# Patient Record
Sex: Female | Born: 1980 | Race: White | Hispanic: No | Marital: Married | Smoking: Never smoker
Health system: Southern US, Community
[De-identification: ages and names within clinical notes are randomized; demographics above are authoritative.]

## PROBLEM LIST (undated history)

## (undated) DIAGNOSIS — M419 Scoliosis, unspecified: Secondary | ICD-10-CM

## (undated) DIAGNOSIS — R011 Cardiac murmur, unspecified: Secondary | ICD-10-CM

## (undated) DIAGNOSIS — B999 Unspecified infectious disease: Secondary | ICD-10-CM

## (undated) DIAGNOSIS — F32A Depression, unspecified: Secondary | ICD-10-CM

## (undated) DIAGNOSIS — F329 Major depressive disorder, single episode, unspecified: Secondary | ICD-10-CM

## (undated) HISTORY — PX: FOOT SURGERY: SHX648

## (undated) HISTORY — PX: DILATION AND CURETTAGE OF UTERUS: SHX78

## (undated) HISTORY — PX: WISDOM TOOTH EXTRACTION: SHX21

## (undated) HISTORY — DX: Scoliosis, unspecified: M41.9

---

## 2010-09-14 ENCOUNTER — Ambulatory Visit: Payer: Self-pay | Admitting: Diagnostic Radiology

## 2010-09-14 ENCOUNTER — Ambulatory Visit (HOSPITAL_BASED_OUTPATIENT_CLINIC_OR_DEPARTMENT_OTHER): Admission: RE | Admit: 2010-09-14 | Discharge: 2010-09-14 | Payer: Self-pay | Admitting: Family Medicine

## 2010-09-14 ENCOUNTER — Ambulatory Visit: Payer: Self-pay | Admitting: Sports Medicine

## 2010-09-14 DIAGNOSIS — M545 Low back pain, unspecified: Secondary | ICD-10-CM | POA: Insufficient documentation

## 2010-09-14 DIAGNOSIS — M4120 Other idiopathic scoliosis, site unspecified: Secondary | ICD-10-CM | POA: Insufficient documentation

## 2011-01-19 NOTE — Assessment & Plan Note (Signed)
Summary: NP,BACK PAIN,MC   Vital Signs:  Patient profile:   30 year old female Height:      65 inches Weight:      147 pounds  History of Present Illness: 30 yo F here with low back pain  Patient reports known history of scoliosis when she was an adolescent. She required the use of a brace which she used intermittently then. Has not seen a doctor in about 15 years for this issue though. Has had intermittent low and mid back pain (none currently) and wanted to make sure scoliosis was stable. No bowel or bladder issues No numbness or tingling in extremities. Has not seen a surgeon to discuss this before. Thinks she has old x-rays at home but this was when she was still skeletally immature.  Problems Prior to Update: 1)  Scoliosis  (ICD-737.30) 2)  Back Pain, Lumbar  (ICD-724.2)  Medications Prior to Update: 1)  None  Allergies (verified): No Known Drug Allergies  Family History: + DM, HTN, and heart disease in grandparents  Social History: recently quit tobacco use - was smoking 1/4 ppd Occasional alcohol use Works for [employer] of [employer]  Physical Exam  General:  Well-developed,well-nourished,in no acute distress; alert,appropriate and cooperative throughout examination Msk:  Back: Right sided rib hump about 30-40 degrees on flexion. No focal midline or paraspinal TTP. FROM. Strength 5/5 BLEs. Sensation intact to light touch BLEs. SLRs negative bilaterally MSRs 3+ and equal in patellar and achilles tendons.   Impression & Recommendations:  Problem # 1:  SCOLIOSIS (ICD-737.30) Assessment New Measured her curve on x-ray to have a cobb angle of 42-45 degrees at maximum, lumbar curve less pronounced.  Because she is skeletally mature, guidelines indicate that treatment is individualized in patients with angles between 40 and 50 degrees and bracing not required in skeletally mature either.  Discussed options of monitoring with repeat x-rays for a couple  years (at 6-12 month intervals) vs discussion with surgeon and would like to monitor for now.  If angle goes beyond 50 degrees, advised her at that point she should consider surgical intervention.  She is agreeable to this plan.  Consider PT for core strengthening - she stated she will do this if back pain issues worsen.  Aleve if needed.  Will see her in 6-12 months.  Orders: Diagnostic X-Ray/Fluoroscopy (Diagnostic X-Ray/Flu)  Problem # 2:  BACK PAIN, LUMBAR (ICD-724.2) Assessment: New No current issues - see #1 above.  Orders: Diagnostic X-Ray/Fluoroscopy (Diagnostic X-Ray/Flu)  Patient Instructions: 1)  You have scoliosis with a measured curve in your thoracic spine of about 42-45 degrees. 2)  We should repeat x-rays in 6 months to 1 year to see if this has progressed. 3)  If at any point you would like to consider surgery (since 40-50 degrees is the controversial range in a mature person), we can refer you to a surgeon to discuss options. 4)  Go to physical therapy for a general strengthening program to minimize spine movement. 5)  This will not be affected by pregnancy. 6)  Aleve 2 tabs twice a day as needed for pain and inflammation. 7)  Heat tends to help with muscle spasm more than ice. 8)  Follow up in 6 - 12 months for repeat x-rays.

## 2012-08-31 ENCOUNTER — Inpatient Hospital Stay (HOSPITAL_COMMUNITY): Admit: 2012-08-31 | Payer: Self-pay

## 2012-08-31 ENCOUNTER — Other Ambulatory Visit (HOSPITAL_COMMUNITY)
Admission: RE | Admit: 2012-08-31 | Discharge: 2012-08-31 | Disposition: A | Discharge status: Home / Self Care | Payer: BC Managed Care – PPO | Source: Ambulatory Visit | Attending: Family Medicine | Admitting: Family Medicine

## 2012-08-31 DIAGNOSIS — Z1151 Encounter for screening for human papillomavirus (HPV): Secondary | ICD-10-CM | POA: Insufficient documentation

## 2012-08-31 DIAGNOSIS — Z124 Encounter for screening for malignant neoplasm of cervix: Secondary | ICD-10-CM | POA: Insufficient documentation

## 2013-05-28 LAB — OB RESULTS CONSOLE ABO/RH: RH Type: POSITIVE

## 2013-05-28 LAB — OB RESULTS CONSOLE RPR: RPR: NONREACTIVE

## 2013-05-28 LAB — OB RESULTS CONSOLE GC/CHLAMYDIA: Gonorrhea: NEGATIVE

## 2013-12-07 ENCOUNTER — Encounter (HOSPITAL_COMMUNITY): Payer: Self-pay | Admitting: *Deleted

## 2013-12-07 ENCOUNTER — Telehealth (HOSPITAL_COMMUNITY): Payer: Self-pay | Admitting: *Deleted

## 2013-12-07 LAB — OB RESULTS CONSOLE GBS: GBS: NEGATIVE

## 2013-12-07 NOTE — Telephone Encounter (Signed)
Preadmission screen ° °

## 2013-12-11 ENCOUNTER — Emergency Department (HOSPITAL_BASED_OUTPATIENT_CLINIC_OR_DEPARTMENT_OTHER): Admission: EM | Admit: 2013-12-11 | Payer: BC Managed Care – PPO | Source: Home / Self Care

## 2013-12-13 ENCOUNTER — Encounter (HOSPITAL_COMMUNITY): Payer: Self-pay | Admitting: *Deleted

## 2013-12-13 ENCOUNTER — Inpatient Hospital Stay (HOSPITAL_COMMUNITY)
Admission: AD | Admit: 2013-12-13 | Discharge: 2013-12-17 | DRG: 765 | Disposition: A | Discharge status: Home / Self Care | Payer: BC Managed Care – PPO | Source: Ambulatory Visit | Attending: Obstetrics and Gynecology | Admitting: Obstetrics and Gynecology

## 2013-12-13 ENCOUNTER — Encounter (HOSPITAL_COMMUNITY): Payer: BC Managed Care – PPO | Admitting: Anesthesiology

## 2013-12-13 ENCOUNTER — Inpatient Hospital Stay (HOSPITAL_COMMUNITY): Payer: BC Managed Care – PPO | Admitting: Anesthesiology

## 2013-12-13 DIAGNOSIS — O411090 Infection of amniotic sac and membranes, unspecified, unspecified trimester, not applicable or unspecified: Secondary | ICD-10-CM | POA: Diagnosis present

## 2013-12-13 DIAGNOSIS — Z98891 History of uterine scar from previous surgery: Secondary | ICD-10-CM

## 2013-12-13 LAB — CBC
HCT: 39.1 % (ref 36.0–46.0)
MCV: 91.8 fL (ref 78.0–100.0)
Platelets: 198 10*3/uL (ref 150–400)
RBC: 4.26 MIL/uL (ref 3.87–5.11)
RDW: 13.3 % (ref 11.5–15.5)
WBC: 14.3 10*3/uL — ABNORMAL HIGH (ref 4.0–10.5)

## 2013-12-13 LAB — POCT FERN TEST: POCT Fern Test: POSITIVE

## 2013-12-13 MED ORDER — DIPHENHYDRAMINE HCL 50 MG/ML IJ SOLN: 12.5000 mg | INTRAMUSCULAR | Status: DC | PRN

## 2013-12-13 MED ORDER — OXYTOCIN 40 UNITS IN LACTATED RINGERS INFUSION - SIMPLE MED
1.0000 m[IU]/min | INTRAVENOUS | Status: DC
  Administered 2013-12-13: 1 m[IU]/min via INTRAVENOUS

## 2013-12-13 MED ORDER — TERBUTALINE SULFATE 1 MG/ML IJ SOLN: 0.2500 mg | Freq: Once | INTRAMUSCULAR | Status: AC | PRN

## 2013-12-13 MED ORDER — BUTORPHANOL TARTRATE 1 MG/ML IJ SOLN
1.0000 mg | Freq: Once | INTRAMUSCULAR | Status: AC
  Administered 2013-12-13: 1 mg via INTRAVENOUS

## 2013-12-13 MED ORDER — EPHEDRINE 5 MG/ML INJ: 10.0000 mg | INTRAVENOUS | Status: DC | PRN

## 2013-12-13 MED ORDER — PHENYLEPHRINE 40 MCG/ML (10ML) SYRINGE FOR IV PUSH (FOR BLOOD PRESSURE SUPPORT)
80.0000 ug | PREFILLED_SYRINGE | INTRAVENOUS | Status: DC | PRN
  Filled 2013-12-13: qty 10

## 2013-12-13 MED ORDER — EPHEDRINE 5 MG/ML INJ
10.0000 mg | INTRAVENOUS | Status: DC | PRN
  Filled 2013-12-13: qty 4

## 2013-12-13 MED ORDER — OXYTOCIN BOLUS FROM INFUSION: 500.0000 mL | INTRAVENOUS | Status: DC

## 2013-12-13 MED ORDER — IBUPROFEN 600 MG PO TABS: 600.0000 mg | ORAL_TABLET | Freq: Four times a day (QID) | ORAL | Status: DC | PRN

## 2013-12-13 MED ORDER — OXYTOCIN 40 UNITS IN LACTATED RINGERS INFUSION - SIMPLE MED
62.5000 mL/h | INTRAVENOUS | Status: DC
  Filled 2013-12-13: qty 1000

## 2013-12-13 MED ORDER — CITRIC ACID-SODIUM CITRATE 334-500 MG/5ML PO SOLN
30.0000 mL | ORAL | Status: DC | PRN
  Administered 2013-12-14: 30 mL via ORAL
  Filled 2013-12-13: qty 15

## 2013-12-13 MED ORDER — LIDOCAINE HCL (PF) 1 % IJ SOLN: 30.0000 mL | INTRAMUSCULAR | Status: DC | PRN

## 2013-12-13 MED ORDER — BUTORPHANOL TARTRATE 1 MG/ML IJ SOLN
INTRAMUSCULAR | Status: AC
  Filled 2013-12-13: qty 1

## 2013-12-13 MED ORDER — LACTATED RINGERS IV SOLN
500.0000 mL | INTRAVENOUS | Status: DC | PRN
  Administered 2013-12-13 – 2013-12-14 (×3): 500 mL via INTRAVENOUS

## 2013-12-13 MED ORDER — FENTANYL 2.5 MCG/ML BUPIVACAINE 1/10 % EPIDURAL INFUSION (WH - ANES)
14.0000 mL/h | INTRAMUSCULAR | Status: DC | PRN
  Administered 2013-12-13 – 2013-12-14 (×2): 14 mL/h via EPIDURAL
  Filled 2013-12-13 (×2): qty 125

## 2013-12-13 MED ORDER — OXYCODONE-ACETAMINOPHEN 5-325 MG PO TABS: 1.0000 | ORAL_TABLET | ORAL | Status: DC | PRN

## 2013-12-13 MED ORDER — FLEET ENEMA 7-19 GM/118ML RE ENEM: 1.0000 | ENEMA | RECTAL | Status: DC | PRN

## 2013-12-13 MED ORDER — LACTATED RINGERS IV SOLN: 500.0000 mL | Freq: Once | INTRAVENOUS | Status: DC

## 2013-12-13 MED ORDER — ACETAMINOPHEN 325 MG PO TABS
650.0000 mg | ORAL_TABLET | ORAL | Status: DC | PRN
  Administered 2013-12-14: 650 mg via ORAL
  Filled 2013-12-13: qty 2

## 2013-12-13 MED ORDER — ONDANSETRON HCL 4 MG/2ML IJ SOLN
4.0000 mg | Freq: Four times a day (QID) | INTRAMUSCULAR | Status: DC | PRN
  Administered 2013-12-13: 4 mg via INTRAVENOUS
  Filled 2013-12-13: qty 2

## 2013-12-13 MED ORDER — SODIUM BICARBONATE 8.4 % IV SOLN
INTRAVENOUS | Status: DC | PRN
  Administered 2013-12-13 – 2013-12-14 (×2): 5 mL via EPIDURAL
  Administered 2013-12-14: 3 mL via EPIDURAL
  Administered 2013-12-14: 7 mL via EPIDURAL

## 2013-12-13 MED ORDER — LACTATED RINGERS IV SOLN
INTRAVENOUS | Status: DC
  Administered 2013-12-13 (×3): via INTRAVENOUS

## 2013-12-13 NOTE — MAU Note (Signed)
Started leaking at 1030, small amt continues.  Contractions every 4 min. Was 1 cm yesterday.

## 2013-12-13 NOTE — Progress Notes (Signed)
Report received; care assumed.

## 2013-12-13 NOTE — Progress Notes (Signed)
Patient ID: Maureen Armstrong, female   DOB: January 09, 1981, 32 y.o.   MRN: [id number] Per the RN exam the cervix is 1+ cm dilated but 90 % effaced and the vertex is at -1/-2 station. She requests an epidural

## 2013-12-13 NOTE — Anesthesia Procedure Notes (Addendum)
Epidural Patient location during procedure: OB  Preanesthetic Checklist Completed: patient identified, site marked, surgical consent, pre-op evaluation, timeout performed, IV checked, risks and benefits discussed and monitors and equipment checked  Epidural Patient position: sitting Prep: site prepped and draped and DuraPrep Patient monitoring: continuous pulse ox and blood pressure Approach: midline Injection technique: LOR air  Needle:  Needle type: Tuohy  Needle gauge: 17 G Needle length: 9 cm and 9 Needle insertion depth: 5 cm cm Catheter type: closed end flexible Catheter size: 19 Gauge Catheter at skin depth: 10 cm Test dose: negative  Assessment Events: blood not aspirated, injection not painful, no injection resistance, negative IV test and no paresthesia  Additional Notes Challanging placement due to scoliosis  Dosing of Epidural:  1st dose, through catheter ............................................Marland Kitchen epi 1:200K + Xylocaine 40 mg  2nd dose, through catheter, after waiting 3 minutes...Marland KitchenMarland Kitchenepi 1:200K + Xylocaine 60 mg    ( 2% Xylo charted as a single dose in Epic Meds for ease of charting; actual dosing was fractionated as above, for saftey's sake)  As each dose occurred, patient was free of IV sx; and patient exhibited no evidence of SA injection.  Patient is more comfortable after epidural dosed. Please see RN's note for documentation of vital signs,and FHR which are stable.  Patient reminded not to try to ambulate with numb legs, and that an RN must be present when she attempts to get up.

## 2013-12-13 NOTE — Progress Notes (Signed)
Patient ID: Maureen Armstrong, female   DOB: 18-Aug-1981, 32 y.o.   MRN: [id number] At 10:20 PM the cervix was 4-5 cm 80 % effaced and the vertex is at - 1 station The FHR had shown some late decelerations earlier but now the FHR is acceptable.

## 2013-12-13 NOTE — H&P (Signed)
NAMEQUAMESHA, MULLET NO.:  [id number]  MEDICAL RECORD NO.:  [id number]  LOCATION:  9164                          FACILITY:  [facility]  PHYSICIAN:  Malachi Pro. Ambrose Mantle, M.D. DATE OF BIRTH:  06-Aug-1981  DATE OF ADMISSION:  12/13/2013 DATE OF DISCHARGE:                             HISTORY & PHYSICAL   HISTORY OF PRESENT ILLNESS:  This is a 32 year old white female, para 0- 0-2-0, gravida 3, EDC December 14, 2013, who is admitted with ruptured membranes.  The patient's blood group and type is O positive with a negative antibody.  RPR nonreactive.  Urine culture negative.  Hepatitis B surface antigen negative.  HIV negative.  GC and Chlamydia negative. First trimester screen negative.  One-hour Glucola 77.  Group B strep negative.  Rubella was equivocal.  The patient had a benign prenatal course, and came to the hospital after thinking she had ruptured her membranes on the day of admission.  Rupture of membranes was confirmed with a positive fern and she was admitted.  PAST MEDICAL HISTORY:  Revealed no known drug allergies.  Medical Illnesses:  Apparently a congenital scoliosis.  SURGERIES:  Two therapeutic abortions.  FAMILY HISTORY:  Father with an MI and high blood pressure.  Mother with high blood pressure.  Paternal grandfather, high blood pressure and CVA. Maternal grandfather, diabetes mellitus and high blood pressure.  The patient also had a history of mild dysplasia in 2008.  PHYSICAL EXAMINATION:  VITAL SIGNS:  On admission, blood pressure 125/70, temperature 97.5, pulse 70, respirations 18. HEART:  Normal size and sounds.  No murmurs. LUNGS:  Clear to auscultation. ABDOMEN:  Fundal height appropriate for gestational age.  Per the RN exam, the cervix is 2 cm, 50%, vertex at a -1.  ADMITTING IMPRESSION:  Intrauterine pregnancy, 39 weeks 6 days, ruptured membranes.  The patient is admitted.  If her contractions do not improve, we will start Pitocin for  Pitocin augmentation of labor.     Malachi Pro. Ambrose Mantle, M.D.     TFH/MEDQ  D:  12/13/2013  T:  12/13/2013  Job:  [id number]

## 2013-12-13 NOTE — Anesthesia Preprocedure Evaluation (Signed)
Anesthesia Evaluation  °Patient identified by MRN, date of birth, ID band °Patient awake ° ° ° °Reviewed: °Allergy & Precautions, H&P , Patient's Chart, lab work & pertinent test results ° °Airway °Mallampati: II °TM Distance: >3 FB °Neck ROM: full ° ° ° Dental ° °(+) Teeth Intact °  °Pulmonary ° °breath sounds clear to auscultation ° ° ° ° ° ° ° Cardiovascular °Rhythm:regular Rate:Normal ° ° °  °Neuro/Psych °  ° GI/Hepatic °  °Endo/Other  ° ° Renal/GU °  ° °  °Musculoskeletal ° ° Abdominal °  °Peds ° Hematology °  °Anesthesia Other Findings ° ° ° ° ° ° Reproductive/Obstetrics °(+) Pregnancy ° °  ° ° ° ° ° ° ° ° ° ° ° ° ° °  °  ° ° ° ° ° ° °Anesthesia Physical °Anesthesia Plan ° °ASA: II ° °Anesthesia Plan: Epidural  ° °Post-op Pain Management:   ° °Induction:  ° °Airway Management Planned:  ° °Additional Equipment:  ° °Intra-op Plan:  ° °Post-operative Plan:  ° °Informed Consent: I have reviewed the patients History and Physical, chart, labs and discussed the procedure including the risks, benefits and alternatives for the proposed anesthesia with the patient or authorized representative who has indicated his/her understanding and acceptance.  ° °Dental Advisory Given ° °Plan Discussed with:  ° °Anesthesia Plan Comments: (Labs checked- platelets confirmed with RN in room. Fetal heart tracing, per RN, reported to be stable enough for sitting procedure. °Discussed epidural, and patient consents to the procedure:  included risk of possible headache,backache, failed block, allergic reaction, and nerve injury. This patient was asked if she had any questions or concerns before the procedure started.)  ° ° ° ° ° ° °Anesthesia Quick Evaluation ° °

## 2013-12-14 ENCOUNTER — Encounter (HOSPITAL_COMMUNITY): Payer: Self-pay | Admitting: Anesthesiology

## 2013-12-14 ENCOUNTER — Encounter (HOSPITAL_COMMUNITY)
Admission: AD | Disposition: A | Discharge status: Home / Self Care | Payer: Self-pay | Source: Ambulatory Visit | Attending: Obstetrics and Gynecology

## 2013-12-14 DIAGNOSIS — Z98891 History of uterine scar from previous surgery: Secondary | ICD-10-CM

## 2013-12-14 LAB — ABO/RH: ABO/RH(D): O POS

## 2013-12-14 SURGERY — Surgical Case
Anesthesia: Epidural | Site: Abdomen

## 2013-12-14 MED ORDER — ZOLPIDEM TARTRATE 5 MG PO TABS: 5.0000 mg | ORAL_TABLET | Freq: Every evening | ORAL | Status: DC | PRN

## 2013-12-14 MED ORDER — HYDROMORPHONE HCL PF 1 MG/ML IJ SOLN
INTRAMUSCULAR | Status: AC
  Administered 2013-12-14: 0.5 mg via INTRAVENOUS
  Filled 2013-12-14: qty 1

## 2013-12-14 MED ORDER — SODIUM CHLORIDE 0.9 % IJ SOLN: 3.0000 mL | INTRAMUSCULAR | Status: DC | PRN

## 2013-12-14 MED ORDER — ONDANSETRON HCL 4 MG/2ML IJ SOLN: 4.0000 mg | Freq: Three times a day (TID) | INTRAMUSCULAR | Status: DC | PRN

## 2013-12-14 MED ORDER — SIMETHICONE 80 MG PO CHEW
80.0000 mg | CHEWABLE_TABLET | Freq: Three times a day (TID) | ORAL | Status: DC
  Administered 2013-12-14 – 2013-12-16 (×7): 80 mg via ORAL
  Filled 2013-12-14 (×7): qty 1

## 2013-12-14 MED ORDER — SODIUM CHLORIDE 0.9 % IV SOLN
3.0000 g | Freq: Four times a day (QID) | INTRAVENOUS | Status: DC
  Administered 2013-12-14 – 2013-12-16 (×8): 3 g via INTRAVENOUS
  Filled 2013-12-14 (×10): qty 3

## 2013-12-14 MED ORDER — SIMETHICONE 80 MG PO CHEW: 80.0000 mg | CHEWABLE_TABLET | ORAL | Status: DC | PRN

## 2013-12-14 MED ORDER — PRENATAL MULTIVITAMIN CH
1.0000 | ORAL_TABLET | Freq: Every day | ORAL | Status: DC
  Administered 2013-12-15 – 2013-12-16 (×2): 1 via ORAL
  Filled 2013-12-14 (×2): qty 1

## 2013-12-14 MED ORDER — OXYTOCIN 40 UNITS IN LACTATED RINGERS INFUSION - SIMPLE MED: 62.5000 mL/h | INTRAVENOUS | Status: AC

## 2013-12-14 MED ORDER — SODIUM CHLORIDE 0.9 % IV SOLN
3.0000 g | Freq: Once | INTRAVENOUS | Status: DC
  Administered 2013-12-14: 3 g via INTRAVENOUS
  Filled 2013-12-14: qty 3

## 2013-12-14 MED ORDER — LIDOCAINE-EPINEPHRINE (PF) 2 %-1:200000 IJ SOLN
INTRAMUSCULAR | Status: AC
  Filled 2013-12-14: qty 20

## 2013-12-14 MED ORDER — MENTHOL 3 MG MT LOZG: 1.0000 | LOZENGE | OROMUCOSAL | Status: DC | PRN

## 2013-12-14 MED ORDER — IBUPROFEN 600 MG PO TABS: 600.0000 mg | ORAL_TABLET | Freq: Four times a day (QID) | ORAL | Status: DC | PRN

## 2013-12-14 MED ORDER — HYDROMORPHONE HCL PF 1 MG/ML IJ SOLN
0.2500 mg | INTRAMUSCULAR | Status: DC | PRN
  Administered 2013-12-14 (×2): 0.5 mg via INTRAVENOUS

## 2013-12-14 MED ORDER — SIMETHICONE 80 MG PO CHEW
80.0000 mg | CHEWABLE_TABLET | ORAL | Status: DC
  Administered 2013-12-14 – 2013-12-16 (×3): 80 mg via ORAL
  Filled 2013-12-14 (×3): qty 1

## 2013-12-14 MED ORDER — ONDANSETRON HCL 4 MG/2ML IJ SOLN
INTRAMUSCULAR | Status: DC | PRN
  Administered 2013-12-14: 4 mg via INTRAVENOUS

## 2013-12-14 MED ORDER — DIPHENHYDRAMINE HCL 50 MG/ML IJ SOLN: 25.0000 mg | INTRAMUSCULAR | Status: DC | PRN

## 2013-12-14 MED ORDER — MEPERIDINE HCL 25 MG/ML IJ SOLN
INTRAMUSCULAR | Status: DC | PRN
  Administered 2013-12-14 (×2): 12.5 mg via INTRAVENOUS

## 2013-12-14 MED ORDER — LACTATED RINGERS IV SOLN
INTRAVENOUS | Status: AC
  Administered 2013-12-14: 13:00:00 via INTRAVENOUS

## 2013-12-14 MED ORDER — IBUPROFEN 600 MG PO TABS
600.0000 mg | ORAL_TABLET | Freq: Four times a day (QID) | ORAL | Status: DC
  Administered 2013-12-14 – 2013-12-17 (×12): 600 mg via ORAL
  Filled 2013-12-14 (×12): qty 1

## 2013-12-14 MED ORDER — MORPHINE SULFATE 0.5 MG/ML IJ SOLN
INTRAMUSCULAR | Status: AC
  Filled 2013-12-14: qty 10

## 2013-12-14 MED ORDER — SODIUM CHLORIDE 0.9 % IV SOLN
3.0000 g | INTRAVENOUS | Status: DC | PRN
  Administered 2013-12-14: 3 g via INTRAVENOUS

## 2013-12-14 MED ORDER — MEASLES, MUMPS & RUBELLA VAC ~~LOC~~ INJ
0.5000 mL | INJECTION | Freq: Once | SUBCUTANEOUS | Status: DC
  Filled 2013-12-14: qty 0.5

## 2013-12-14 MED ORDER — NALOXONE HCL 1 MG/ML IJ SOLN
1.0000 ug/kg/h | INTRAVENOUS | Status: DC | PRN
  Filled 2013-12-14: qty 2

## 2013-12-14 MED ORDER — KETOROLAC TROMETHAMINE 30 MG/ML IJ SOLN
30.0000 mg | Freq: Four times a day (QID) | INTRAMUSCULAR | Status: DC | PRN
  Administered 2013-12-14: 30 mg via INTRAVENOUS

## 2013-12-14 MED ORDER — DIPHENHYDRAMINE HCL 25 MG PO CAPS
25.0000 mg | ORAL_CAPSULE | ORAL | Status: DC | PRN
  Administered 2013-12-16: 25 mg via ORAL
  Filled 2013-12-14: qty 1

## 2013-12-14 MED ORDER — PROMETHAZINE HCL 25 MG/ML IJ SOLN: 6.2500 mg | INTRAMUSCULAR | Status: DC | PRN

## 2013-12-14 MED ORDER — NALOXONE HCL 0.4 MG/ML IJ SOLN: 0.4000 mg | INTRAMUSCULAR | Status: DC | PRN

## 2013-12-14 MED ORDER — MORPHINE SULFATE (PF) 0.5 MG/ML IJ SOLN
INTRAMUSCULAR | Status: DC | PRN
  Administered 2013-12-14: 1 mg via INTRAVENOUS
  Administered 2013-12-14: 4 mg via EPIDURAL

## 2013-12-14 MED ORDER — DIPHENHYDRAMINE HCL 25 MG PO CAPS: 25.0000 mg | ORAL_CAPSULE | Freq: Four times a day (QID) | ORAL | Status: DC | PRN

## 2013-12-14 MED ORDER — SODIUM BICARBONATE 8.4 % IV SOLN
INTRAVENOUS | Status: AC
  Filled 2013-12-14: qty 50

## 2013-12-14 MED ORDER — SENNOSIDES-DOCUSATE SODIUM 8.6-50 MG PO TABS
2.0000 | ORAL_TABLET | ORAL | Status: DC
  Administered 2013-12-14 – 2013-12-16 (×3): 2 via ORAL
  Filled 2013-12-14 (×3): qty 2

## 2013-12-14 MED ORDER — MEPERIDINE HCL 25 MG/ML IJ SOLN: 6.2500 mg | INTRAMUSCULAR | Status: DC | PRN

## 2013-12-14 MED ORDER — TETANUS-DIPHTH-ACELL PERTUSSIS 5-2.5-18.5 LF-MCG/0.5 IM SUSP: 0.5000 mL | Freq: Once | INTRAMUSCULAR | Status: DC

## 2013-12-14 MED ORDER — ONDANSETRON HCL 4 MG PO TABS: 4.0000 mg | ORAL_TABLET | ORAL | Status: DC | PRN

## 2013-12-14 MED ORDER — KETOROLAC TROMETHAMINE 30 MG/ML IJ SOLN
INTRAMUSCULAR | Status: AC
  Filled 2013-12-14: qty 1

## 2013-12-14 MED ORDER — METOCLOPRAMIDE HCL 5 MG/ML IJ SOLN: 10.0000 mg | Freq: Three times a day (TID) | INTRAMUSCULAR | Status: DC | PRN

## 2013-12-14 MED ORDER — SCOPOLAMINE 1 MG/3DAYS TD PT72
MEDICATED_PATCH | TRANSDERMAL | Status: AC
  Administered 2013-12-14: 1.5 mg via TRANSDERMAL
  Filled 2013-12-14: qty 1

## 2013-12-14 MED ORDER — PHENYLEPHRINE 40 MCG/ML (10ML) SYRINGE FOR IV PUSH (FOR BLOOD PRESSURE SUPPORT)
PREFILLED_SYRINGE | INTRAVENOUS | Status: AC
  Filled 2013-12-14: qty 5

## 2013-12-14 MED ORDER — DIBUCAINE 1 % RE OINT: 1.0000 "application " | TOPICAL_OINTMENT | RECTAL | Status: DC | PRN

## 2013-12-14 MED ORDER — ONDANSETRON HCL 4 MG/2ML IJ SOLN: 4.0000 mg | INTRAMUSCULAR | Status: DC | PRN

## 2013-12-14 MED ORDER — 0.9 % SODIUM CHLORIDE (POUR BTL) OPTIME
TOPICAL | Status: DC | PRN
  Administered 2013-12-14: 1000 mL

## 2013-12-14 MED ORDER — LACTATED RINGERS IV SOLN
INTRAVENOUS | Status: DC | PRN
  Administered 2013-12-14: 04:00:00 via INTRAVENOUS

## 2013-12-14 MED ORDER — OXYTOCIN 10 UNIT/ML IJ SOLN
40.0000 [IU] | INTRAVENOUS | Status: DC | PRN
  Administered 2013-12-14: 40 [IU] via INTRAVENOUS

## 2013-12-14 MED ORDER — LACTATED RINGERS IV SOLN
INTRAVENOUS | Status: DC | PRN
  Administered 2013-12-14 (×2): via INTRAVENOUS

## 2013-12-14 MED ORDER — WITCH HAZEL-GLYCERIN EX PADS: 1.0000 "application " | MEDICATED_PAD | CUTANEOUS | Status: DC | PRN

## 2013-12-14 MED ORDER — MEPERIDINE HCL 25 MG/ML IJ SOLN
INTRAMUSCULAR | Status: AC
  Filled 2013-12-14: qty 1

## 2013-12-14 MED ORDER — NALBUPHINE SYRINGE 5 MG/0.5 ML
5.0000 mg | INJECTION | INTRAMUSCULAR | Status: DC | PRN
  Filled 2013-12-14: qty 1

## 2013-12-14 MED ORDER — PHENYLEPHRINE HCL 10 MG/ML IJ SOLN
INTRAMUSCULAR | Status: DC | PRN
  Administered 2013-12-14 (×2): 80 ug via INTRAVENOUS

## 2013-12-14 MED ORDER — NALBUPHINE SYRINGE 5 MG/0.5 ML
5.0000 mg | INJECTION | INTRAMUSCULAR | Status: DC | PRN
  Administered 2013-12-14: 10 mg via INTRAVENOUS
  Filled 2013-12-14 (×2): qty 1

## 2013-12-14 MED ORDER — OXYTOCIN 10 UNIT/ML IJ SOLN
INTRAMUSCULAR | Status: AC
  Filled 2013-12-14: qty 4

## 2013-12-14 MED ORDER — KETOROLAC TROMETHAMINE 30 MG/ML IJ SOLN: 15.0000 mg | Freq: Once | INTRAMUSCULAR | Status: DC | PRN

## 2013-12-14 MED ORDER — OXYCODONE-ACETAMINOPHEN 5-325 MG PO TABS
1.0000 | ORAL_TABLET | ORAL | Status: DC | PRN
  Administered 2013-12-15 – 2013-12-16 (×5): 1 via ORAL
  Filled 2013-12-14 (×5): qty 1

## 2013-12-14 MED ORDER — ONDANSETRON HCL 4 MG/2ML IJ SOLN
INTRAMUSCULAR | Status: AC
  Filled 2013-12-14: qty 2

## 2013-12-14 MED ORDER — DIPHENHYDRAMINE HCL 50 MG/ML IJ SOLN: 12.5000 mg | INTRAMUSCULAR | Status: DC | PRN

## 2013-12-14 MED ORDER — KETOROLAC TROMETHAMINE 30 MG/ML IJ SOLN: 30.0000 mg | Freq: Four times a day (QID) | INTRAMUSCULAR | Status: DC | PRN

## 2013-12-14 MED ORDER — LANOLIN HYDROUS EX OINT: 1.0000 "application " | TOPICAL_OINTMENT | CUTANEOUS | Status: DC | PRN

## 2013-12-14 MED ORDER — SCOPOLAMINE 1 MG/3DAYS TD PT72
1.0000 | MEDICATED_PATCH | Freq: Once | TRANSDERMAL | Status: DC
  Administered 2013-12-14: 1.5 mg via TRANSDERMAL

## 2013-12-14 SURGICAL SUPPLY — 31 items
CLAMP CORD UMBIL (MISCELLANEOUS) ×2 IMPLANT
CLOTH BEACON ORANGE TIMEOUT ST (SAFETY) ×2 IMPLANT
CONTAINER PREFILL 10% NBF 15ML (MISCELLANEOUS) IMPLANT
DRAPE LG THREE QUARTER DISP (DRAPES) ×2 IMPLANT
DRSG OPSITE POSTOP 4X10 (GAUZE/BANDAGES/DRESSINGS) ×2 IMPLANT
DRSG VASELINE 3X18 (GAUZE/BANDAGES/DRESSINGS) IMPLANT
DURAPREP 26ML APPLICATOR (WOUND CARE) ×2 IMPLANT
ELECT REM PT RETURN 9FT ADLT (ELECTROSURGICAL) ×2
ELECTRODE REM PT RTRN 9FT ADLT (ELECTROSURGICAL) ×1 IMPLANT
EXTRACTOR VACUUM KIWI (MISCELLANEOUS) IMPLANT
EXTRACTOR VACUUM M CUP 4 TUBE (SUCTIONS) IMPLANT
GLOVE BIO SURGEON STRL SZ7.5 (GLOVE) ×2 IMPLANT
GOWN PREVENTION PLUS XLARGE (GOWN DISPOSABLE) ×2 IMPLANT
GOWN STRL REIN XL XLG (GOWN DISPOSABLE) ×2 IMPLANT
KIT ABG SYR 3ML LUER SLIP (SYRINGE) IMPLANT
NEEDLE HYPO 25X5/8 SAFETYGLIDE (NEEDLE) IMPLANT
NS IRRIG 1000ML POUR BTL (IV SOLUTION) ×2 IMPLANT
PACK C SECTION WH (CUSTOM PROCEDURE TRAY) ×2 IMPLANT
PAD OB MATERNITY 4.3X12.25 (PERSONAL CARE ITEMS) ×2 IMPLANT
RTRCTR C-SECT PINK 25CM LRG (MISCELLANEOUS) ×2 IMPLANT
STAPLER VISISTAT 35W (STAPLE) ×2 IMPLANT
SUT PLAIN 0 NONE (SUTURE) IMPLANT
SUT VIC AB 0 CT1 36 (SUTURE) ×10 IMPLANT
SUT VIC AB 3-0 CTX 36 (SUTURE) ×2 IMPLANT
SUT VIC AB 3-0 SH 27 (SUTURE) ×1
SUT VIC AB 3-0 SH 27X BRD (SUTURE) ×1 IMPLANT
SUT VIC AB 4-0 KS 27 (SUTURE) IMPLANT
SUT VICRYL 0 TIES 12 18 (SUTURE) IMPLANT
TOWEL OR 17X24 6PK STRL BLUE (TOWEL DISPOSABLE) ×2 IMPLANT
TRAY FOLEY CATH 14FR (SET/KITS/TRAYS/PACK) IMPLANT
WATER STERILE IRR 1000ML POUR (IV SOLUTION) IMPLANT

## 2013-12-14 NOTE — Anesthesia Postprocedure Evaluation (Signed)
°  Anesthesia Post-op Note  Patient: Maureen Armstrong  Procedure(s) Performed: Procedure(s): Primary Cesarean Section Delivery Baby Girl @ [phone], Apgars (N/A)  Patient Location: Mother/Baby  Anesthesia Type:Epidural  Level of Consciousness: awake, oriented and patient cooperative  Airway and Oxygen Therapy: Patient Spontanous Breathing  Post-op Pain: none  Post-op Assessment: Patient's Cardiovascular Status Stable, Respiratory Function Stable, Patent Airway, No signs of Nausea or vomiting, Adequate PO intake and Pain level controlled  Post-op Vital Signs: Reviewed and stable  Complications: No apparent anesthesia complications

## 2013-12-14 NOTE — Transfer of Care (Signed)
Immediate Anesthesia Transfer of Care Note  Patient: Maureen Armstrong  Procedure(s) Performed: Procedure(s): Primary Cesarean Section Delivery Baby Girl @ [phone], Apgars (N/A)  Patient Location: PACU  Anesthesia Type:Epidural  Level of Consciousness: awake, alert , oriented and patient cooperative  Airway & Oxygen Therapy: Patient Spontanous Breathing  Post-op Assessment: Report given to PACU RN, Post -op Vital signs reviewed and stable and Patient moving all extremities  Post vital signs: Reviewed and stable  Complications: No apparent anesthesia complications

## 2013-12-14 NOTE — Anesthesia Postprocedure Evaluation (Signed)
Anesthesia Post Note  Patient: Maureen Armstrong  Procedure(s) Performed: Procedure(s) (LRB): Primary Cesarean Section Delivery Baby Girl @ [phone], Apgars (N/A)  Anesthesia type: Epidural  Patient location: PACU  Post pain: Pain level controlled  Post assessment: Post-op Vital signs reviewed  Last Vitals:  Filed Vitals:   12/14/13 0545  BP: 122/60  Pulse: 108  Temp:   Resp: 22    Post vital signs: Reviewed  Level of consciousness: awake  Complications: No apparent anesthesia complications

## 2013-12-14 NOTE — Progress Notes (Signed)
Patient ID: Maureen Armstrong, female   DOB: Mar 16, 1981, 32 y.o.   MRN: [id number] Pitocin is at 7 mu/inute and the contractions are q 2-4 minutes. The cervix ios 7 cm 100% effaced and the vertex is at -1/0 station. The exam is done with the pt on her side since prior exams on her back have led to bradycardia and decelerations. Temp 100.4 I have explained to the pt that with the fever she will need to continue to make progress we will proceed with a c section.

## 2013-12-14 NOTE — Progress Notes (Signed)
Patient ID: Maureen Armstrong, female   DOB: [birth year], 32 y.o.   MRN: [id number] Contractions are q 2 to 3 and 1/2 minutes. There are some decelerations. Temp 101.1 The cervix is 7-8 cm 100% effaced and the vertex is at 0 station. Because of probable chorioamnionitis and decelerations prohibiting ideal doses of pitocin I have advised proceeding with c section. Also, the pelvis feels very narrow.

## 2013-12-14 NOTE — Op Note (Signed)
NAMEAQUA, DENSLOW NO.:  [id number]  MEDICAL RECORD NO.:  [id number]  LOCATION:  [facility]                          FACILITY:  [facility]  PHYSICIAN:  Malachi Pro. Ambrose Mantle, M.D. DATE OF BIRTH:  1981-03-26  DATE OF PROCEDURE:  12/14/2013 DATE OF DISCHARGE:                              OPERATIVE REPORT   PREOPERATIVE DIAGNOSIS:  Intrauterine pregnancy, 40 weeks, with premature rupture of the membranes, recurrent fetal heart rate deceleration, probable chorioamnionitis, and poor progress in labor.  POSTOPERATIVE DIAGNOSIS:  Intrauterine pregnancy, 40 weeks, with premature rupture of the membranes, recurrent fetal heart rate deceleration, probable chorioamnionitis, and poor progress in labor.  OPERATION:  Low-transverse cervical C-section.  OPERATOR:  Malachi Pro. Ambrose Mantle, M.D.  ANESTHESIA:  Epidural anesthesia.  DESCRIPTION OF PROCEDURE:  The patient was brought to the operating room.  Her epidural anesthetic had already been placed hours before and after it was tested and found to be satisfactory for surgery, a Foley catheter was already indwelling, the abdomen was prepped with DuraPrep. During the 3 minute wait for it to dry, a time-out was done.  Then the abdomen was draped as a sterile field.  The patient had a large furuncle in the right pubic area, and I told her that I would try to make the incision little higher than normal to try to stay away from that. Anesthesia was confirmed.  After the abdomen was draped as a sterile field, I did make an incision in the Steri-Drape, so that I would cut right on to the skin.  After testing anesthesia and confirming there was adequate anesthesia, I made a transverse incision through the skin, subcutaneous tissue, and fascia.  Fascia was then separated from the rectus muscle superiorly and inferiorly.  The rectus muscle was already slightly split in the midline.  The peritoneum was opened.  I made incision in the peritoneum a little  larger, protecting the bladder at all times.  I then inserted an Teacher, early years/pre, but I felt like there was a little bowel occult under the left lateral weighing of the retractor, so I took the retractor out and used a Balfour bladder blade, made a short incision through the superficial layers of the myometrium transversely in the lower uterine segment, entered the amniotic sac with my finger, enlarged the incision by pulling superiorly and inferiorly, pulled the vertex out of the pelvis.  It seemed to be posterior, rotated around, delivered the vertex, suction the nose and pharynx with the bulb syringe, and then delivered the rest of the baby.  Clamped the cord and gave the infant to Dr. Ruben Gottron who was in attendance.  He assigned the infant's Apgars.  After I returned to the table, I obtained the regular cord blood samples and removed the placenta intact.  Inspected the inside of the uterus, found it to be free of any products of conception; however,  the uterus was quite boggy, so I exteriorized the uterus, massaged it very vigorously as well as running Pitocin IV and noticed increased tone.  The uterus was then reinserted into the abdominal cavity.  The uterine incisional edges were grasped with ring forceps, and I closed the uterine incision with  2 running sutures of 0 Vicryl locking the first layer and nonlocking on the second layer.  It is what appeared to be a very small hematoma on the right side.  After liberal irrigation confirmed almost complete hemostasis, I used interrupted figure-of-eight suture of 3-0 Vicryl for complete hemostasis, and in order to be sure that there was no hematoma growing, I exteriorized the uterus again, palpated the broad ligaments at the level of the right uterine angle.  There was no hematoma at all.  Uterus was replaced into the abdominal cavity.  Hemostasis was complete. Liberal irrigation confirmed that.  I closed the abdominal wall  with interrupted sutures of 0 Vicryl to close the rectus muscle and peritoneum in one layer, 2 running sutures of 0 Vicryl on the fascia, a running 3-0 Vicryl on the subcutaneous tissue, and staples on the skin. The patient seemed to tolerate the procedure well, was returned to recovery in satisfactory condition.     Malachi Pro. Ambrose Mantle, M.D.     TFH/MEDQ  D:  12/14/2013  T:  12/14/2013  Job:  [id number]

## 2013-12-15 NOTE — Lactation Note (Signed)
This note was copied from the chart of Maureen Jerah Esty. Lactation Consultation Note Follow up visit at 35 hours of age.  Mom reports not sure how much baby is getting and she is fussy.  Mom holding baby in cradle hold with shallow latch.  Mom reports milk nipple soreness.  Assisted to cross cradle hold baby not latching deeply and very fussy.  Repositioned to football and baby remains fussy, skin to skin on chest to calm baby who roots over to right breast where baby already fed for 10 minutes.  Encouraged formula supplementation with syringe to help satisfy baby as already started with previous LC.  Mom reports baby is gassy.  Discussed benefits of baby having a good feeding.  Baby latches well with formula in syringe dripped onto the breast and stays on with wide flanged lips and good rhythmic suckling.  Some assistance to roll bottom lip out needed and encouraged mom to work on chin tug as demonstrated.  Colostrum expressed and rubbed into nipples for comfort.  Encouraged mom to watch for early feeding cues and hand express prior to latch.  Continue to use supplement as needed for encouraged breast suckling.  Supplementation guidelines for age given and discussed.  Reviewed basics extensively.  Moms sister at bedside very supportive.  Baby pulled off breast after 8 mls and about 10 minutes of continuous suckling, appears very satisfied and mom burps her.  Mom to call for assist as needed and feels good about this plan.   Patient Name: Maureen Armstrong [id number] Date: 12/15/2013 Reason for consult: Follow-up assessment;Difficult latch   Maternal Data Has patient been taught Hand Expression?: Yes  Feeding Feeding Type: Breast Fed Length of feed: 15 min  LATCH Score/Interventions                      Lactation Tools Discussed/Used     Consult Status      Maureen Armstrong, Arvella Merles 12/15/2013, 3:17 PM

## 2013-12-15 NOTE — Anesthesia Postprocedure Evaluation (Signed)
Anesthesia Post Note  Patient: Maureen Armstrong  Procedure(s) Performed: Procedure(s) (LRB): Primary Cesarean Section Delivery Baby Girl @ [phone], Apgars (N/A)  Anesthesia type: Epidural  Patient location: Mother/Baby  Post pain: Pain level controlled  Post assessment: Post-op Vital signs reviewed  Last Vitals:  Filed Vitals:   12/15/13 0546  BP: 97/58  Pulse: 64  Temp: 36.8 C  Resp: 17    Post vital signs: Reviewed  Level of consciousness:alert  Complications: No apparent anesthesia complications

## 2013-12-15 NOTE — Progress Notes (Signed)
Subjective: Postpartum Day #1: Cesarean Delivery Patient reports incisional pain and tolerating PO.    Objective: Vital signs in last 24 hours: Temp:  [97.2 F (36.2 C)-98.6 F (37 C)] 98.3 F (36.8 C) (12/27 0546) Pulse Rate:  [64-91] 64 (12/27 0546) Resp:  [17-19] 17 (12/27 0546) BP: (97-113)/(57-70) 97/58 mmHg (12/27 0546) SpO2:  [93 %-96 %] 96 % (12/27 0546)  Physical Exam:  General: alert Lochia: appropriate Uterine Fundus: firm Incision: dressing C/D/I   Recent Labs  12/13/13 1340 12/14/13 1825  HGB 13.7 11.0*  HCT 39.1 32.9*    Assessment/Plan: Status post Cesarean section. Doing well postoperatively.  Has been afebrile since delivery Continue current care, ambulate.  Will continue Unasyn today, d/c it tomorrow if remains afebrile..  Maureen Armstrong D 12/15/2013, 9:17 AM

## 2013-12-16 ENCOUNTER — Encounter (HOSPITAL_COMMUNITY): Payer: Self-pay | Admitting: *Deleted

## 2013-12-16 MED ORDER — BACITRACIN-NEOMYCIN-POLYMYXIN OINTMENT TUBE
TOPICAL_OINTMENT | Freq: Four times a day (QID) | CUTANEOUS | Status: DC
  Administered 2013-12-16: 23:00:00 via TOPICAL
  Filled 2013-12-16: qty 15

## 2013-12-16 MED ORDER — NEOMYCIN-POLYMYXIN-PRAMOXINE 1 % EX CREA
TOPICAL_CREAM | Freq: Four times a day (QID) | CUTANEOUS | Status: DC
  Filled 2013-12-16: qty 28

## 2013-12-16 NOTE — Progress Notes (Signed)
POD #2 Doing ok, feels better today Afeb, VSS Abd- soft, fundus firm, incision intact Will d/c Unasyn and continue routine care

## 2013-12-16 NOTE — Lactation Note (Signed)
This note was copied from the chart of Maureen Armstrong. Lactation Consultation Note Follow up visit at 53 hours of age.  Mom reports breastfeeding is going well.  Mom continues to use sns for formula supplementation.   Baby has only had one void and one stool in 24 hours.  Baby fuss in crib, mom changes wet diaper that is dark and concentrated. Discussed baby and me booklet regarding feeding frequency and output guidelines.  Baby showing feeding cues and minimal assistance needed to latch baby in football hold on right breast.  Mom waiting for wide open mouth and will unlatch if it hurts to correct latch.  Hand expression done on left breast with several drops of colostrum.  Encouraged to supplement with own colostrum before formula.  Discussed feeding with cues on demand and cluster feedings.  Mom is planning discharge today.   Patient Name: Maureen Bren Borys ZOXWR'U Date: 12/16/2013 Reason for consult: Follow-up assessment   Maternal Data    Feeding Feeding Type: Formula Length of feed: 15 min  LATCH Score/Interventions Latch: Grasps breast easily, tongue down, lips flanged, rhythmical sucking. Intervention(s): Breast compression;Assist with latch  Audible Swallowing: A few with stimulation Intervention(s): Hand expression;Skin to skin;Alternate breast massage  Type of Nipple: Everted at rest and after stimulation  Comfort (Breast/Nipple): Filling, red/small blisters or bruises, mild/mod discomfort  Problem noted: Mild/Moderate discomfort (small bruise on nipple)  Hold (Positioning): No assistance needed to correctly position infant at breast.  LATCH Score: 8  Lactation Tools Discussed/Used     Consult Status Consult Status: Complete    Sonda Coppens, Arvella Merles 12/16/2013, 9:32 AM

## 2013-12-16 NOTE — Progress Notes (Signed)
Dr. Jackelyn Knife notified of Red raw excoriated areas around edges of drsg to incision lower abd. Orders to remove drsg. And apply neosporin. Red raised area to Rt. Upper super pubic area draining sangenous fluid w/occassional white thick pus when compressed from raised inflamed pour.States looks much better than it did several days ago.

## 2013-12-17 ENCOUNTER — Encounter (HOSPITAL_COMMUNITY): Payer: Self-pay | Admitting: Obstetrics and Gynecology

## 2013-12-17 ENCOUNTER — Inpatient Hospital Stay (HOSPITAL_COMMUNITY): Admission: RE | Admit: 2013-12-17 | Payer: BC Managed Care – PPO | Source: Ambulatory Visit

## 2013-12-17 MED ORDER — IBUPROFEN 600 MG PO TABS: 600.0000 mg | ORAL_TABLET | Freq: Four times a day (QID) | ORAL | Status: DC | PRN

## 2013-12-17 MED ORDER — OXYCODONE-ACETAMINOPHEN 5-325 MG PO TABS: 1.0000 | ORAL_TABLET | Freq: Four times a day (QID) | ORAL | Status: DC | PRN

## 2013-12-17 NOTE — Progress Notes (Signed)
Patient ID: Maureen Armstrong, female   DOB: [birth year], 32 y.o.   MRN: [id number] #3 afebrile BP normal. She is tolerating a regular diet, passing flatus and has had a BM. She is voiding well and is ambulating without difficulty The furuncle that was on her right pubic area has drained pus and there is marked swelling of the right labium majus The swelling is soft and edematous without induration or erythema. I think this is secondary to the infection of the right pubic furuncle. The incision looks good.

## 2013-12-18 LAB — CBC
MCHC: 33.4 g/dL (ref 30.0–36.0)
Platelets: 147 10*3/uL — ABNORMAL LOW (ref 150–400)
RBC: 3.44 MIL/uL — ABNORMAL LOW (ref 3.87–5.11)
WBC: 18 10*3/uL — ABNORMAL HIGH (ref 4.0–10.5)

## 2013-12-18 NOTE — Discharge Summary (Signed)
NAMEDESA, RECH NO.:  [id number]  MEDICAL RECORD NO.:  [id number]  LOCATION:  9104                          FACILITY:  [facility]  PHYSICIAN:  Malachi Pro. Ambrose Mantle, M.D. DATE OF BIRTH:  March 22, 1981  DATE OF ADMISSION:  12/13/2013 DATE OF DISCHARGE:  12/17/2013                              DISCHARGE SUMMARY   A 32 year old white female, para 0-0-2-0 gravida 3, EDC 12/14/2013, admitted with ruptured membranes.  Lab data was all normal except one equivocal rubella.  After admission to the hospital, the patient did not want to start Pitocin right away.  So after poor progress, she was started on Pitocin and made poor progress, but the progress could have been watched longer had not fetal heart rate decelerations and fever of 101.1 intervened.  The patient was taken to the operating room, and underwent a low-transverse cervical C-section by Dr. Ambrose Mantle with delivery of a living 7 pounds 14 ounce female infant.  The patient was noted to have a large furuncle in the right pubic area prior to delivery.  I tried to stay away from this.  After delivery, this area drained significantly purulent material, and the dressing was removed. The incision looks completely intact but the patient does have edema of the right labium majus.  I find no other cause with this other than as a reaction to the intense infection she had in the furuncle in the right pubic area.  She was passing flatus.  She has had a bowel movement, voiding well, tolerating a regular diet and ambulating well and she was ready for discharge.  Initial hemoglobin 13.7, hematocrit 39.1, white count 14,300, platelet count of 198,000, RPR nonreactive.  Followup hemoglobin 11.0 hematocrit 32.9, white count 18000.  The patient was treated with Unasyn at the time of delivery for the elevated temperature of 101.1.  The Unasyn was continued for about 48 hours and she never became febrile after delivery.  FINAL DIAGNOSES:   Intrauterine pregnancy at term, delivered by C- section, chorioamnionitis, poor progress in labor.  Fetal heart rate decelerations.  Furuncle of the right pubic area that has drained since admission to the hospital.  OPERATION:  Low-transverse cervical C-section.  FINAL CONDITION:  Improved.  INSTRUCTIONS:  Include regular instructions.  The discharge instruction booklet as well as the after visit summary.  She is advised to call if she has any fever greater than 100.4 degrees.  Call with any unusual problems.  MEDICATIONS:  Percocet 5/325, 30 tablets, 1 every 6 hours as needed for pain and Motrin 600 mg 30 tablets, 1 every 6 hours as needed for pain. She is advised to return to the office in about 4-7 days to have the staples removed.    Malachi Pro. Ambrose Mantle, M.D.    TFH/MEDQ  D:  12/17/2013  T:  12/17/2013  Job:  [id number]

## 2013-12-18 NOTE — Patient Instructions (Signed)
°

## 2013-12-18 NOTE — Consent Form (Signed)
°

## 2014-10-21 ENCOUNTER — Encounter (HOSPITAL_COMMUNITY): Payer: Self-pay | Admitting: Obstetrics and Gynecology

## 2015-10-06 ENCOUNTER — Other Ambulatory Visit: Payer: Self-pay | Admitting: Orthopedic Surgery

## 2015-10-06 DIAGNOSIS — S93324A Dislocation of tarsometatarsal joint of right foot, initial encounter: Secondary | ICD-10-CM

## 2015-10-08 ENCOUNTER — Ambulatory Visit
Admission: RE | Admit: 2015-10-08 | Discharge: 2015-10-08 | Disposition: A | Discharge status: Home / Self Care | Payer: BLUE CROSS/BLUE SHIELD | Source: Ambulatory Visit | Attending: Orthopedic Surgery | Admitting: Orthopedic Surgery

## 2015-10-08 DIAGNOSIS — S93324A Dislocation of tarsometatarsal joint of right foot, initial encounter: Secondary | ICD-10-CM

## 2016-08-25 LAB — OB RESULTS CONSOLE GC/CHLAMYDIA
Chlamydia: NEGATIVE
GC PROBE AMP, GENITAL: NEGATIVE

## 2016-08-25 LAB — OB RESULTS CONSOLE ANTIBODY SCREEN: ANTIBODY SCREEN: NEGATIVE

## 2016-08-25 LAB — OB RESULTS CONSOLE HIV ANTIBODY (ROUTINE TESTING): HIV: NONREACTIVE

## 2016-08-25 LAB — OB RESULTS CONSOLE ABO/RH: RH TYPE: POSITIVE

## 2016-08-25 LAB — OB RESULTS CONSOLE RUBELLA ANTIBODY, IGM: Rubella: IMMUNE

## 2016-08-25 LAB — OB RESULTS CONSOLE RPR: RPR: NONREACTIVE

## 2016-08-25 LAB — OB RESULTS CONSOLE HEPATITIS B SURFACE ANTIGEN: Hepatitis B Surface Ag: NEGATIVE

## 2017-03-04 LAB — OB RESULTS CONSOLE GBS: STREP GROUP B AG: NEGATIVE

## 2017-03-17 ENCOUNTER — Telehealth (HOSPITAL_COMMUNITY): Payer: Self-pay | Admitting: *Deleted

## 2017-03-17 ENCOUNTER — Encounter (HOSPITAL_COMMUNITY): Payer: Self-pay | Admitting: *Deleted

## 2017-03-17 NOTE — Telephone Encounter (Signed)
Preadmission screen ° °

## 2017-03-21 ENCOUNTER — Telehealth (HOSPITAL_COMMUNITY): Payer: Self-pay | Admitting: *Deleted

## 2017-03-21 NOTE — Telephone Encounter (Signed)
Preadmission screen ° °

## 2017-03-22 ENCOUNTER — Encounter (HOSPITAL_COMMUNITY)
Admission: AD | Disposition: A | Discharge status: Home / Self Care | Payer: Self-pay | Source: Ambulatory Visit | Attending: Obstetrics and Gynecology

## 2017-03-22 ENCOUNTER — Encounter (HOSPITAL_COMMUNITY): Payer: Self-pay | Admitting: *Deleted

## 2017-03-22 ENCOUNTER — Inpatient Hospital Stay (HOSPITAL_COMMUNITY)
Admission: AD | Admit: 2017-03-22 | Discharge: 2017-03-25 | DRG: 766 | Disposition: A | Discharge status: Home / Self Care | Payer: BLUE CROSS/BLUE SHIELD | Source: Ambulatory Visit | Attending: Obstetrics and Gynecology | Admitting: Obstetrics and Gynecology

## 2017-03-22 ENCOUNTER — Inpatient Hospital Stay (HOSPITAL_COMMUNITY): Payer: BLUE CROSS/BLUE SHIELD | Admitting: Anesthesiology

## 2017-03-22 DIAGNOSIS — Z8249 Family history of ischemic heart disease and other diseases of the circulatory system: Secondary | ICD-10-CM | POA: Diagnosis not present

## 2017-03-22 DIAGNOSIS — Z833 Family history of diabetes mellitus: Secondary | ICD-10-CM | POA: Diagnosis not present

## 2017-03-22 DIAGNOSIS — O4292 Full-term premature rupture of membranes, unspecified as to length of time between rupture and onset of labor: Secondary | ICD-10-CM | POA: Diagnosis present

## 2017-03-22 DIAGNOSIS — Z87891 Personal history of nicotine dependence: Secondary | ICD-10-CM

## 2017-03-22 DIAGNOSIS — Z3A38 38 weeks gestation of pregnancy: Secondary | ICD-10-CM | POA: Diagnosis not present

## 2017-03-22 DIAGNOSIS — Z302 Encounter for sterilization: Secondary | ICD-10-CM

## 2017-03-22 DIAGNOSIS — O34211 Maternal care for low transverse scar from previous cesarean delivery: Secondary | ICD-10-CM | POA: Diagnosis present

## 2017-03-22 HISTORY — DX: Unspecified infectious disease: B99.9

## 2017-03-22 HISTORY — PX: TUBAL LIGATION: SHX77

## 2017-03-22 LAB — CBC
HCT: 38 % (ref 36.0–46.0)
HEMOGLOBIN: 12.8 g/dL (ref 12.0–15.0)
MCH: 31.7 pg (ref 26.0–34.0)
MCHC: 33.7 g/dL (ref 30.0–36.0)
MCV: 94.1 fL (ref 78.0–100.0)
PLATELETS: 184 10*3/uL (ref 150–400)
RBC: 4.04 MIL/uL (ref 3.87–5.11)
RDW: 13.4 % (ref 11.5–15.5)
WBC: 9 10*3/uL (ref 4.0–10.5)

## 2017-03-22 LAB — TYPE AND SCREEN
ABO/RH(D): O POS
ANTIBODY SCREEN: NEGATIVE

## 2017-03-22 LAB — POCT FERN TEST: POCT Fern Test: POSITIVE

## 2017-03-22 LAB — RPR: RPR Ser Ql: NONREACTIVE

## 2017-03-22 SURGERY — Surgical Case
Anesthesia: Spinal | Site: Abdomen | Wound class: Clean Contaminated

## 2017-03-22 MED ORDER — SIMETHICONE 80 MG PO CHEW
80.0000 mg | CHEWABLE_TABLET | Freq: Three times a day (TID) | ORAL | Status: DC
  Administered 2017-03-22 – 2017-03-25 (×8): 80 mg via ORAL
  Filled 2017-03-22 (×8): qty 1

## 2017-03-22 MED ORDER — MENTHOL 3 MG MT LOZG: 1.0000 | LOZENGE | OROMUCOSAL | Status: DC | PRN

## 2017-03-22 MED ORDER — NALBUPHINE HCL 10 MG/ML IJ SOLN: 5.0000 mg | INTRAMUSCULAR | Status: DC | PRN

## 2017-03-22 MED ORDER — SIMETHICONE 80 MG PO CHEW: 80.0000 mg | CHEWABLE_TABLET | ORAL | Status: DC | PRN

## 2017-03-22 MED ORDER — OXYTOCIN 10 UNIT/ML IJ SOLN
INTRAMUSCULAR | Status: AC
  Filled 2017-03-22: qty 4

## 2017-03-22 MED ORDER — ZOLPIDEM TARTRATE 5 MG PO TABS: 5.0000 mg | ORAL_TABLET | Freq: Every evening | ORAL | Status: DC | PRN

## 2017-03-22 MED ORDER — SOD CITRATE-CITRIC ACID 500-334 MG/5ML PO SOLN
30.0000 mL | Freq: Once | ORAL | Status: AC
  Administered 2017-03-22: 30 mL via ORAL
  Filled 2017-03-22: qty 15

## 2017-03-22 MED ORDER — DIBUCAINE 1 % RE OINT: 1.0000 "application " | TOPICAL_OINTMENT | RECTAL | Status: DC | PRN

## 2017-03-22 MED ORDER — MORPHINE SULFATE-NACL 0.5-0.9 MG/ML-% IV SOSY
PREFILLED_SYRINGE | INTRAVENOUS | Status: DC | PRN
  Administered 2017-03-22: .1 mg via INTRATHECAL

## 2017-03-22 MED ORDER — BUPIVACAINE HCL (PF) 0.5 % IJ SOLN
INTRAMUSCULAR | Status: AC
  Filled 2017-03-22: qty 30

## 2017-03-22 MED ORDER — SODIUM CHLORIDE 0.9 % IR SOLN
Status: DC | PRN
  Administered 2017-03-22: 1000 mL

## 2017-03-22 MED ORDER — SCOPOLAMINE 1 MG/3DAYS TD PT72
1.0000 | MEDICATED_PATCH | Freq: Once | TRANSDERMAL | Status: DC
  Filled 2017-03-22: qty 1

## 2017-03-22 MED ORDER — PHENYLEPHRINE 8 MG IN D5W 100 ML (0.08MG/ML) PREMIX OPTIME
INJECTION | INTRAVENOUS | Status: DC | PRN
  Administered 2017-03-22: 60 ug/min via INTRAVENOUS

## 2017-03-22 MED ORDER — MORPHINE SULFATE (PF) 0.5 MG/ML IJ SOLN
INTRAMUSCULAR | Status: AC
  Filled 2017-03-22: qty 10

## 2017-03-22 MED ORDER — COCONUT OIL OIL
1.0000 "application " | TOPICAL_OIL | Status: DC | PRN
  Administered 2017-03-24: 1 via TOPICAL
  Filled 2017-03-22: qty 120

## 2017-03-22 MED ORDER — ACETAMINOPHEN 325 MG PO TABS
650.0000 mg | ORAL_TABLET | ORAL | Status: DC | PRN
  Administered 2017-03-23 – 2017-03-25 (×6): 650 mg via ORAL
  Filled 2017-03-22 (×6): qty 2

## 2017-03-22 MED ORDER — OXYCODONE HCL 5 MG PO TABS
5.0000 mg | ORAL_TABLET | ORAL | Status: DC | PRN
  Administered 2017-03-23 – 2017-03-24 (×3): 5 mg via ORAL
  Filled 2017-03-22 (×3): qty 1

## 2017-03-22 MED ORDER — ONDANSETRON HCL 4 MG/2ML IJ SOLN
INTRAMUSCULAR | Status: DC | PRN
  Administered 2017-03-22: 4 mg via INTRAVENOUS

## 2017-03-22 MED ORDER — SENNOSIDES-DOCUSATE SODIUM 8.6-50 MG PO TABS
2.0000 | ORAL_TABLET | ORAL | Status: DC
  Administered 2017-03-22 – 2017-03-23 (×2): 2 via ORAL
  Filled 2017-03-22 (×3): qty 2

## 2017-03-22 MED ORDER — SODIUM CHLORIDE 0.9% FLUSH: 3.0000 mL | INTRAVENOUS | Status: DC | PRN

## 2017-03-22 MED ORDER — FENTANYL CITRATE (PF) 100 MCG/2ML IJ SOLN
INTRAMUSCULAR | Status: DC | PRN
  Administered 2017-03-22: 25 ug via INTRATHECAL

## 2017-03-22 MED ORDER — LACTATED RINGERS IV SOLN
INTRAVENOUS | Status: DC | PRN
  Administered 2017-03-22: 40 [IU] via INTRAVENOUS

## 2017-03-22 MED ORDER — NALBUPHINE HCL 10 MG/ML IJ SOLN
5.0000 mg | Freq: Once | INTRAMUSCULAR | Status: AC | PRN
  Administered 2017-03-22: 5 mg via INTRAVENOUS
  Filled 2017-03-22: qty 1

## 2017-03-22 MED ORDER — OXYTOCIN 40 UNITS IN LACTATED RINGERS INFUSION - SIMPLE MED: 2.5000 [IU]/h | INTRAVENOUS | Status: AC

## 2017-03-22 MED ORDER — PHENYLEPHRINE HCL 10 MG/ML IJ SOLN
INTRAMUSCULAR | Status: DC | PRN
  Administered 2017-03-22: 80 ug via INTRAVENOUS

## 2017-03-22 MED ORDER — ACETAMINOPHEN 500 MG PO TABS
1000.0000 mg | ORAL_TABLET | Freq: Four times a day (QID) | ORAL | Status: AC
  Administered 2017-03-22 – 2017-03-23 (×4): 1000 mg via ORAL
  Filled 2017-03-22 (×4): qty 2

## 2017-03-22 MED ORDER — WITCH HAZEL-GLYCERIN EX PADS: 1.0000 "application " | MEDICATED_PAD | CUTANEOUS | Status: DC | PRN

## 2017-03-22 MED ORDER — DEXTROSE 5 % IV SOLN
1.0000 ug/kg/h | INTRAVENOUS | Status: DC | PRN
  Filled 2017-03-22: qty 2

## 2017-03-22 MED ORDER — DIPHENHYDRAMINE HCL 50 MG/ML IJ SOLN: 12.5000 mg | INTRAMUSCULAR | Status: DC | PRN

## 2017-03-22 MED ORDER — PRENATAL MULTIVITAMIN CH
1.0000 | ORAL_TABLET | Freq: Every day | ORAL | Status: DC
  Administered 2017-03-23 – 2017-03-24 (×2): 1 via ORAL
  Filled 2017-03-22 (×2): qty 1

## 2017-03-22 MED ORDER — LACTATED RINGERS IV SOLN
INTRAVENOUS | Status: DC | PRN
  Administered 2017-03-22: 13:00:00 via INTRAVENOUS

## 2017-03-22 MED ORDER — PHENYLEPHRINE 8 MG IN D5W 100 ML (0.08MG/ML) PREMIX OPTIME
INJECTION | INTRAVENOUS | Status: AC
  Filled 2017-03-22: qty 100

## 2017-03-22 MED ORDER — FAMOTIDINE IN NACL 20-0.9 MG/50ML-% IV SOLN
20.0000 mg | Freq: Once | INTRAVENOUS | Status: AC
  Administered 2017-03-22: 20 mg via INTRAVENOUS
  Filled 2017-03-22: qty 50

## 2017-03-22 MED ORDER — OXYCODONE HCL 5 MG PO TABS: 10.0000 mg | ORAL_TABLET | ORAL | Status: DC | PRN

## 2017-03-22 MED ORDER — DIPHENHYDRAMINE HCL 25 MG PO CAPS: 25.0000 mg | ORAL_CAPSULE | ORAL | Status: DC | PRN

## 2017-03-22 MED ORDER — MEPERIDINE HCL 25 MG/ML IJ SOLN: 6.2500 mg | INTRAMUSCULAR | Status: DC | PRN

## 2017-03-22 MED ORDER — LACTATED RINGERS IV SOLN
INTRAVENOUS | Status: DC
  Administered 2017-03-22: 09:00:00 via INTRAVENOUS

## 2017-03-22 MED ORDER — DIPHENHYDRAMINE HCL 25 MG PO CAPS: 25.0000 mg | ORAL_CAPSULE | Freq: Four times a day (QID) | ORAL | Status: DC | PRN

## 2017-03-22 MED ORDER — SIMETHICONE 80 MG PO CHEW
80.0000 mg | CHEWABLE_TABLET | ORAL | Status: DC
  Administered 2017-03-22 – 2017-03-25 (×3): 80 mg via ORAL
  Filled 2017-03-22 (×3): qty 1

## 2017-03-22 MED ORDER — ONDANSETRON HCL 4 MG/2ML IJ SOLN
INTRAMUSCULAR | Status: AC
  Filled 2017-03-22: qty 2

## 2017-03-22 MED ORDER — CEFAZOLIN SODIUM-DEXTROSE 2-4 GM/100ML-% IV SOLN
2.0000 g | INTRAVENOUS | Status: AC
  Administered 2017-03-22: 2 g via INTRAVENOUS
  Filled 2017-03-22: qty 100

## 2017-03-22 MED ORDER — PHENYLEPHRINE 40 MCG/ML (10ML) SYRINGE FOR IV PUSH (FOR BLOOD PRESSURE SUPPORT)
PREFILLED_SYRINGE | INTRAVENOUS | Status: AC
  Filled 2017-03-22: qty 10

## 2017-03-22 MED ORDER — NALOXONE HCL 0.4 MG/ML IJ SOLN: 0.4000 mg | INTRAMUSCULAR | Status: DC | PRN

## 2017-03-22 MED ORDER — HYDROMORPHONE HCL 1 MG/ML IJ SOLN: 0.2500 mg | INTRAMUSCULAR | Status: DC | PRN

## 2017-03-22 MED ORDER — IBUPROFEN 600 MG PO TABS
600.0000 mg | ORAL_TABLET | Freq: Four times a day (QID) | ORAL | Status: DC
  Administered 2017-03-22 – 2017-03-25 (×11): 600 mg via ORAL
  Filled 2017-03-22 (×11): qty 1

## 2017-03-22 MED ORDER — TETANUS-DIPHTH-ACELL PERTUSSIS 5-2.5-18.5 LF-MCG/0.5 IM SUSP: 0.5000 mL | Freq: Once | INTRAMUSCULAR | Status: DC

## 2017-03-22 MED ORDER — NALBUPHINE HCL 10 MG/ML IJ SOLN: 5.0000 mg | Freq: Once | INTRAMUSCULAR | Status: AC | PRN

## 2017-03-22 MED ORDER — FENTANYL CITRATE (PF) 100 MCG/2ML IJ SOLN
INTRAMUSCULAR | Status: AC
  Filled 2017-03-22: qty 2

## 2017-03-22 MED ORDER — LACTATED RINGERS IV SOLN: INTRAVENOUS | Status: DC

## 2017-03-22 MED ORDER — ONDANSETRON HCL 4 MG/2ML IJ SOLN: 4.0000 mg | Freq: Three times a day (TID) | INTRAMUSCULAR | Status: DC | PRN

## 2017-03-22 SURGICAL SUPPLY — 29 items
CHLORAPREP W/TINT 26ML (MISCELLANEOUS) ×3 IMPLANT
CLAMP CORD UMBIL (MISCELLANEOUS) ×3 IMPLANT
CLOTH BEACON ORANGE TIMEOUT ST (SAFETY) ×3 IMPLANT
DERMABOND ADVANCED (GAUZE/BANDAGES/DRESSINGS) ×1
DERMABOND ADVANCED .7 DNX12 (GAUZE/BANDAGES/DRESSINGS) ×2 IMPLANT
DRAPE C SECTION CLR SCREEN (DRAPES) ×3 IMPLANT
DRSG OPSITE POSTOP 4X10 (GAUZE/BANDAGES/DRESSINGS) ×3 IMPLANT
ELECT REM PT RETURN 9FT ADLT (ELECTROSURGICAL) ×3
ELECTRODE REM PT RTRN 9FT ADLT (ELECTROSURGICAL) ×2 IMPLANT
GLOVE BIO SURGEON STRL SZ 6.5 (GLOVE) ×3 IMPLANT
GLOVE BIOGEL PI IND STRL 7.0 (GLOVE) ×2 IMPLANT
GLOVE BIOGEL PI INDICATOR 7.0 (GLOVE) ×1
GOWN STRL REUS W/TWL LRG LVL3 (GOWN DISPOSABLE) ×6 IMPLANT
NS IRRIG 1000ML POUR BTL (IV SOLUTION) ×3 IMPLANT
PACK C SECTION WH (CUSTOM PROCEDURE TRAY) ×3 IMPLANT
PAD OB MATERNITY 4.3X12.25 (PERSONAL CARE ITEMS) ×3 IMPLANT
PENCIL SMOKE EVAC W/HOLSTER (ELECTROSURGICAL) ×3 IMPLANT
RETAINER VISCERAL (MISCELLANEOUS) ×3 IMPLANT
RTRCTR C-SECT PINK 25CM LRG (MISCELLANEOUS) ×3 IMPLANT
SUT CHROMIC 1 CTX 36 (SUTURE) ×6 IMPLANT
SUT PLAIN 0 NONE (SUTURE) ×3 IMPLANT
SUT PLAIN 2 0 XLH (SUTURE) ×3 IMPLANT
SUT VIC AB 0 CT1 27 (SUTURE) ×2
SUT VIC AB 0 CT1 27XBRD ANBCTR (SUTURE) ×4 IMPLANT
SUT VIC AB 2-0 CT1 27 (SUTURE) ×1
SUT VIC AB 2-0 CT1 TAPERPNT 27 (SUTURE) ×2 IMPLANT
SUT VIC AB 4-0 KS 27 (SUTURE) ×3 IMPLANT
TOWEL OR 17X24 6PK STRL BLUE (TOWEL DISPOSABLE) ×3 IMPLANT
TRAY FOLEY BAG SILVER LF 14FR (SET/KITS/TRAYS/PACK) ×3 IMPLANT

## 2017-03-22 NOTE — MAU Note (Signed)
Calls to : OR Nurse, adult, Data processing manager, central nursery and anes [place] [facility] [place]).

## 2017-03-22 NOTE — Anesthesia Procedure Notes (Signed)
Spinal  Patient location during procedure: OR Staffing Anesthesiologist: Cristela Blue Preanesthetic Checklist Completed: patient identified, site marked, surgical consent, pre-op evaluation, timeout performed, IV checked, risks and benefits discussed and monitors and equipment checked Spinal Block Patient position: sitting Prep: DuraPrep Patient monitoring: heart rate, cardiac monitor, continuous pulse ox and blood pressure Approach: midline Location: L3-4 Injection technique: single-shot Needle Needle type: Sprotte  Needle gauge: 24 G Needle length: 9 cm Assessment Sensory level: T4 Additional Notes Spinal Dosage in OR  .5% Bupivicaine ml       1.9     [id number]   mcg        100    Fentanyl mcg            25

## 2017-03-22 NOTE — MAU Note (Signed)
0530 water broke, clear fluid, still coming. Denies contractions, no bleeding.

## 2017-03-22 NOTE — H&P (Signed)
Maureen Armstrong is a 36 y.o. female presenting for spontaneous rupture of membranes this am. Pt denies any contractions at this time. Her prenatal care has been uncomplicated. She is dated based on an 8week [place]. She is AMA. She declines genetic screening. She had one episode of bleeding at [redacted] weeks gestation that spontaneously resolved. She has a history of c/s and is requesting repeat with permanent sterilization.   OB History    Gravida Para Term Preterm AB Living   SAB TAB Ectopic Multiple Live Births     2     1     Past Medical History:  Diagnosis Date   Depression    since adolescence  off meds   Heart murmur    Infection    UTI   Scoliosis    Past Surgical History:  Procedure Laterality Date   CESAREAN SECTION N/A 12/14/2013   Procedure: Primary Cesarean Section Delivery Baby Girl @ [phone], Apgars;  Surgeon: Bing Plume, MD;  Location: [facility];  Service: Obstetrics;  Laterality: N/A;   DILATION AND CURETTAGE OF UTERUS     FOOT SURGERY     WISDOM TOOTH EXTRACTION     Family History: family history includes Cancer in her father and maternal grandmother; Diabetes in her maternal grandfather; Heart disease in her father, maternal grandfather, and paternal grandmother; Mental retardation in her cousin; Stroke in her paternal grandfather. Social History:  reports that she has quit smoking. She has never used smokeless tobacco. She reports that she does not drink alcohol or use drugs.     Maternal Diabetes: No Genetic Screening: Declined Maternal Ultrasounds/Referrals: Normal Fetal Ultrasounds or other Referrals:  None Maternal Substance Abuse:  No Significant Maternal Medications:  None Significant Maternal Lab Results:  Lab values include: Group B Strep negative Other Comments:  None  Review of Systems  Constitutional: Negative for chills, fever, malaise/fatigue and weight loss.  Eyes: Negative for blurred vision.  Respiratory: Negative for shortness  of breath.   Cardiovascular: Negative for chest pain.  Gastrointestinal: Negative for abdominal pain, heartburn, nausea and vomiting.  Genitourinary: Negative for dysuria.  Musculoskeletal: Negative for myalgias.  Skin: Negative for itching and rash.  Neurological: Negative for dizziness and headaches.  Endo/Heme/Allergies: Does not bruise/bleed easily.  Psychiatric/Behavioral: Negative for depression, hallucinations, substance abuse and suicidal ideas. The patient is not nervous/anxious.    Maternal Medical History:  Reason for admission: Rupture of membranes.  Nausea.  Contractions: Frequency: rare.    Fetal activity: Perceived fetal activity is normal.   Last perceived fetal movement was within the past hour.    Prenatal complications: no prenatal complications Prenatal Complications - Diabetes: none.      Blood pressure 116/75, pulse 88, temperature 97.8 F (36.6 C), temperature source Oral, resp. rate 16, height 5' 4.5" (1.638 m), weight 191 lb 12 oz (87 kg), last menstrual period 06/19/2016, SpO2 100 %, unknown if currently breastfeeding. Maternal Exam:  Uterine Assessment: Contraction frequency is rare.   Abdomen: Patient reports no abdominal tenderness. Surgical scars: low transverse.   Estimated fetal weight is AGA.   Fetal presentation: vertex  Introitus: Normal vulva. Vulva is negative for condylomata and lesion.  Normal vagina.  Vagina is negative for condylomata.  Ferning test: positive.  Nitrazine test: positive. Amniotic fluid character: clear.  Cervix: Cervix evaluated by digital exam.     Physical Exam  Constitutional: She is oriented to person, place, and time. She appears  well-developed and well-nourished.  Neck: Normal range of motion.  Cardiovascular: Normal rate.   Respiratory: Effort normal.  GI: Soft.  Genitourinary: Vagina normal and uterus normal. Vulva exhibits no lesion.  Musculoskeletal: Normal range of motion. She exhibits edema.   Neurological: She is alert and oriented to person, place, and time.  Psychiatric: She has a normal mood and affect. Her behavior is normal. Judgment and thought content normal.    Prenatal labs: ABO, Rh: O/Positive/-- (09/06 0000) Antibody: Negative (09/06 0000) Rubella: Immune (09/06 0000) RPR: Nonreactive (09/06 0000)  HBsAg: Negative (09/06 0000)  HIV: Non-reactive (09/06 0000)  GBS: Negative (03/16 0000)   Assessment/Plan: [id number] at 38 4/[redacted]wks gestation with SROM Hx prior c/s - for repeat with BTL at noon Strict NPO Offered LARC as alternative BC - pt declines TO OR when ready  Consent verified and all questions answered    Maureen Armstrong 03/22/2017, 9:36 AM

## 2017-03-22 NOTE — Anesthesia Postprocedure Evaluation (Signed)
Anesthesia Post Note  Patient: Maureen Armstrong  Procedure(s) Performed: Procedure(s) (LRB): CESAREAN SECTION (N/A) BILATERAL TUBAL LIGATION (Bilateral)  Patient location during evaluation: Mother Baby Anesthesia Type: Spinal Level of consciousness: awake and alert and oriented Pain management: pain level controlled Vital Signs Assessment: post-procedure vital signs reviewed and stable Respiratory status: spontaneous breathing and nonlabored ventilation Cardiovascular status: stable Postop Assessment: no headache, patient able to bend at knees, no backache, no signs of nausea or vomiting, adequate PO intake and spinal receding Anesthetic complications: no        Last Vitals:  Vitals:   03/22/17 1455 03/22/17 1600  BP: 112/69 (!) 88/70  Pulse: 69 71  Resp: 18 18  Temp: 36.6 C 36.6 C    Last Pain:  Vitals:   03/22/17 1600  TempSrc: Oral  PainSc: 3    Pain Goal: Patients Stated Pain Goal: 3 (03/22/17 1600)               [name]

## 2017-03-22 NOTE — Lactation Note (Signed)
This note was copied from a baby's chart. Lactation Consultation Note  Patient Name: Maureen Armstrong [id number] Date: 03/22/2017 Reason for consult: Initial assessment  Initial visit at 7 hours of age.  Mom reports experience with older child nursing for 4 1/2 months.  Mom reports baby had a good feeding in PACU and is doing great.  Baby showing feeding cues now.  Mom is able to hand express drops of colostrum easily.  FOB assisted with placing baby STS. Mom 1st attempts cradle hold with shallow latch.  LC encouraged mom to try cross cradle to allow her more control over latching baby well and deeper.  Mom happy for assistance and does well latching with minimal assist.  Baby has strong rhythmic sucking with good jaw movements noted for several minutes of feeding.  Mom denies pain or concerns at this time.   [facility] LC resources given and discussed.  Encouraged to feed with early cues on demand.  Early newborn behavior discussed.  Hand expression demonstrated with colostrum visible.  Mom to call for assist as needed.     Maternal Data Has patient been taught Hand Expression?: Yes Does the patient have breastfeeding experience prior to this delivery?: Yes  Feeding Feeding Type: Breast Fed Length of feed:  (several minutes observed)  LATCH Score/Interventions Latch: Grasps breast easily, tongue down, lips flanged, rhythmical sucking.  Audible Swallowing: A few with stimulation Intervention(s): Skin to skin;Hand expression  Type of Nipple: Everted at rest and after stimulation  Comfort (Breast/Nipple): Soft / non-tender     Hold (Positioning): Assistance needed to correctly position infant at breast and maintain latch. Intervention(s): Breastfeeding basics reviewed;Support Pillows;Position options;Skin to skin  LATCH Score: 8  Lactation Tools Discussed/Used WIC Program: No   Consult Status Consult Status: Follow-up Date: 03/23/17 Follow-up type: In-patient    Naijah Lacek, Arvella Merles 03/22/2017, 8:29 PM

## 2017-03-22 NOTE — Anesthesia Preprocedure Evaluation (Addendum)
Anesthesia Evaluation  Patient identified by MRN, date of birth, ID band Patient awake    Reviewed: Allergy & Precautions, H&P , Patient's Chart, lab work & pertinent test results  Airway Mallampati: II  TM Distance: >3 FB Neck ROM: full    Dental no notable dental hx.    Pulmonary former smoker,    Pulmonary exam normal breath sounds clear to auscultation       Cardiovascular Exercise Tolerance: Good  Rhythm:regular Rate:Normal     Neuro/Psych    GI/Hepatic   Endo/Other    Renal/GU      Musculoskeletal   Abdominal   Peds  Hematology   Anesthesia Other Findings   Reproductive/Obstetrics                            Anesthesia Physical Anesthesia Plan  ASA: II  Anesthesia Plan: Spinal   Post-op Pain Management:    Induction:   Airway Management Planned:   Additional Equipment:   Intra-op Plan:   Post-operative Plan:   Informed Consent: I have reviewed the patients History and Physical, chart, labs and discussed the procedure including the risks, benefits and alternatives for the proposed anesthesia with the patient or authorized representative who has indicated his/her understanding and acceptance.     Plan Discussed with:   Anesthesia Plan Comments: (  )       Anesthesia Quick Evaluation

## 2017-03-22 NOTE — Op Note (Addendum)
Operative Note    Preoperative Diagnosis: 1.  IUP at 38 4/7wks 2. Prior C/S 3. SROM 4. Requests permanent sterilization   Postoperative Diagnosis: Same  Procedure: Repeat Low Transverse cesarean section and bilateral tubal ligation   Surgeon: Britt Bottom DO  Anesthesia: Spinal  Fluids: LR  EBL: UOP:  Findings: Viable female infant in cephalic presentation apgars of 9 and 9; weight pending; grossly nl uterus, tubes and ovaries   Specimen: portion of left and right tube to pathology; Placenta to L/D    Procedure Note Patient was taken to the operating room where spinal anesthesia was administered. She was prepped and draped in the normal sterile fashion and placed in the dorsal supine position with a leftward tilt. An appropriate time out was performed. An allis clamp test was performed and anesthesia was found to be adequate.  A Pfannenstiel skin incision was then made through the previous incision with the scalpel and carried through to the underlying layer of fascia by sharp dissection. The fascia was nicked in the midline and the incision was extended laterally with Mayo scissors. The superior  aspect of the incision was grasped Coker clamps and dissected off the underlying rectus muscles. In a similar fashion the inferior aspect was dissected off the rectus muscles. Rectus muscles were slightly scarred but were separated in the midline with the bovie and the peritoneal cavity entered bluntly. The peritoneal incision was then extended both superiorly and inferiorly with careful attention to avoid both bowel and bladder. The Alexis self-retaining wound retractor was then placed within the incision and the lower uterine segment exposed. The lower uterine segment was then incised in a transverse fashion and the cavity itself entered bluntly. Clear amniotic fluid was noted.  The incision was extended bluntly. The infant's head was then lifted and delivered from the  incision without difficulty. The remainder of the infant delivered and the nose and mouth bulb suctioned with the cord clamped and cut as well. The infant was handed off to the waiting pediatricians. The placenta was then spontaneously expressed from the uterus and the uterus cleared of all clots and debris with moist lap sponge. The uterine incision was then repaired with a  running locked layer 0 chromic suture. A second imbricated layer using the same suture was perfrmred.  . The left and then right tubes were then grasped with babcocks in the mid isthmus region and an intervening 3cm portion tied and cut.  Excellent hemostasis was appreciated. The  ovaries were inspected and the gutters cleared of all clots and debris. The uterine incision was inspected and found to be hemostatic. All instruments and sponges as well as the Alexis retractor were then removed from the abdomen. The peritoneum was closed using 2-0 vicryl in a pursestring fashion and the same suture used to reapproximate the rectus muscles in a large figure 8 stitch.  The fascia was then closed with 0 Vicryl in a running fashion. Subcutaneous tissue was irrigated and small areas of bleeding bovied and reapproximated. The skin was closed with a subcuticular stitch of 4-0 Vicryl on a Keith needle and then reinforced with glue. At the conclusion of the procedure all instruments and sponge counts were correct. Patient was taken to the recovery room in good condition with her baby accompanying her skin to skin.

## 2017-03-22 NOTE — Transfer of Care (Signed)
Immediate Anesthesia Transfer of Care Note  Patient: Maureen Armstrong  Procedure(s) Performed: Procedure(s): CESAREAN SECTION (N/A) BILATERAL TUBAL LIGATION (Bilateral)  Patient Location: PACU  Anesthesia Type:Spinal  Level of Consciousness: awake  Airway & Oxygen Therapy: Patient Spontanous Breathing  Post-op Assessment: Report given to RN  Post vital signs: Reviewed and stable  Last Vitals:  Vitals:   03/22/17 0756  BP: 116/75  Pulse: 88  Resp: 16  Temp: 36.6 C    Last Pain:  Vitals:   03/22/17 0756  TempSrc: Oral         Complications: No apparent anesthesia complications

## 2017-03-22 NOTE — Progress Notes (Signed)
Notified Dr. Mindi Slicker of phlebitis at IV site. MD did not order another IV to be placed.

## 2017-03-23 LAB — CBC
HCT: 32.5 % — ABNORMAL LOW (ref 36.0–46.0)
Hemoglobin: 11.3 g/dL — ABNORMAL LOW (ref 12.0–15.0)
MCH: 32.7 pg (ref 26.0–34.0)
MCHC: 34.8 g/dL (ref 30.0–36.0)
MCV: 93.9 fL (ref 78.0–100.0)
PLATELETS: 156 10*3/uL (ref 150–400)
RBC: 3.46 MIL/uL — AB (ref 3.87–5.11)
RDW: 13.3 % (ref 11.5–15.5)
WBC: 11.3 10*3/uL — ABNORMAL HIGH (ref 4.0–10.5)

## 2017-03-23 LAB — BIRTH TISSUE RECOVERY COLLECTION (PLACENTA DONATION)

## 2017-03-23 NOTE — Progress Notes (Signed)
Subjective: Postpartum Day 1: Cesarean Delivery Patient reports tolerating PO and no problems voiding.  Just tired with baby eating a lot!  Objective: Vital signs in last 24 hours: Temp:  [97.8 F (36.6 C)-98.5 F (36.9 C)] 98 F (36.7 C) (04/04 0315) Pulse Rate:  [64-88] 73 (04/04 0315) Resp:  [16-21] 18 (04/04 0315) BP: (88-134)/(46-86) 115/65 (04/04 0315) SpO2:  [96 %-100 %] 99 % (04/04 0315) Weight:  [87 kg (191 lb 12 oz)] 87 kg (191 lb 12 oz) (04/03 0756)  Physical Exam:  General: alert and cooperative Lochia: appropriate Uterine Fundus: firm Incision: C/D/I    Recent Labs  03/22/17 0850 03/23/17 0522  HGB 12.8 11.3*  HCT 38.0 32.5*    Assessment/Plan: Status post Cesarean section. Doing well postoperatively.  Continue current care.  Oliver Pila 03/23/2017, 7:19 AM

## 2017-03-23 NOTE — Lactation Note (Signed)
This note was copied from a baby's chart. Lactation Consultation Note  Assisted mother with deeper latch and demonstrated breast compression to aid in transfer. Explained how to perform a chin tug to encourage longer jaw excursion. Mother reported deeper tugs and also more swallows were noted. She will continue to work with baby. Suggested hand expressing and spoon feeding as necessary. Follow-up tomorrow.  Patient Name: Maureen Armstrong [id number] Date: 03/23/2017 Reason for consult: Follow-up assessment   Maternal Data    Feeding    LATCH Score/Interventions Latch: Repeated attempts needed to sustain latch, nipple held in mouth throughout feeding, stimulation needed to elicit sucking reflex.  Audible Swallowing: A few with stimulation  Type of Nipple: Everted at rest and after stimulation  Comfort (Breast/Nipple): Soft / non-tender     Hold (Positioning): Assistance needed to correctly position infant at breast and maintain latch.  LATCH Score: 7  Lactation Tools Discussed/Used     Consult Status Consult Status: Follow-up Date: 03/24/17 Follow-up type: In-patient    Soyla Dryer 03/23/2017, 3:41 PM

## 2017-03-23 NOTE — Progress Notes (Signed)
MOB was referred for history of depression/anxiety. * Referral screened out by Clinical Social Worker because none of the following criteria appear to apply: ~ History of anxiety/depression during this pregnancy, or of post-partum depression. ~ Diagnosis of anxiety and/or depression within last 3 years OR * MOB's symptoms currently being treated with medication and/or therapy. Please contact the Clinical Social Worker if needs arise, or if MOB requests.  MOB's chart notes depression since adolescence.  MOB is currently 36 years old.

## 2017-03-24 NOTE — Progress Notes (Signed)
Patient ID: Maureen Armstrong, female   DOB: 02-20-1981, 36 y.o.   MRN: [id number] Pt doing well. Pain well controlled with meds. Ambulating and tolerating diet with no issues. Lochia mild. Breastfeeding . Has no complaints. Bonding well with baby VSS ABD - soft, ND, incision with glue c/d/I EXT - no Homans, no edema  11.3>11.3<156  A/P: POD#2 s/p repeat c/s and BTL - stable         Routine pp/post op care         Likely discharge to home tomorrow

## 2017-03-24 NOTE — Lactation Note (Signed)
This note was copied from a baby's chart. Lactation Consultation Note Baby slept well after had a large stool and passed gas. Mom states the baby isn't fussy anymore. Mom BF baby in cross cradle position. Has good latch. Gave mom coconut oil for soreness.  Baby has 8% weight loss in 35 hrs. w/9 voids, 8 stools. Very large output. Concerned about the fussiness, noted to stop after gas and stool passed.  Encouraged mom to hand express after BF and give colostrum to baby as supplement. Will re-weight baby at 0630 for weight check. RN informed of status. Patient Name: Maureen Armstrong [id number] Date: 03/24/2017 Reason for consult: Follow-up assessment;Infant weight loss   Maternal Data    Feeding Feeding Type: Breast Fed Length of feed: 20 min (still BF)  LATCH Score/Interventions Latch: Grasps breast easily, tongue down, lips flanged, rhythmical sucking. Intervention(s): Assist with latch  Audible Swallowing: A few with stimulation Intervention(s): Skin to skin  Type of Nipple: Everted at rest and after stimulation  Comfort (Breast/Nipple): Filling, red/small blisters or bruises, mild/mod discomfort  Problem noted: Mild/Moderate discomfort Interventions (Mild/moderate discomfort): Hand massage;Hand expression  Hold (Positioning): No assistance needed to correctly position infant at breast. Intervention(s): Position options  LATCH Score: 8  Lactation Tools Discussed/Used     Consult Status Consult Status: Follow-up Date: 03/24/17 Follow-up type: In-patient    Maureen Armstrong, Diamond Nickel 03/24/2017, 2:15 AM

## 2017-03-24 NOTE — Lactation Note (Signed)
This note was copied from a baby's chart. Lactation Consultation Note Re-weigh baby d/t increased weight loss. Weight loss increased from 8% to 9%. Baby has BF well w/satisfied sleeping after BF. Mom can hand express colostrum well. Mom shown how to use DEBP & how to disassemble, clean, & reassemble parts. Mom knows to pump q3h for 15-20 min. Gave supplementing feeding sheet for mom to supplement after BF. Mom is to give colostrum. If doesn't have enough colostrum call staff. LC will f/u today. Notified CN RN and RN of mom.  ? Is baby getting transfer from mom. Encouraged to assess breast before and after BF for transfer.  Patient Name: Girl Shatona Andujar [id number] Date: 03/24/2017 Reason for consult: Follow-up assessment;Infant weight loss   Maternal Data    Feeding Feeding Type: Breast Fed Length of feed: 20 min  LATCH Score/Interventions       Type of Nipple: Everted at rest and after stimulation  Comfort (Breast/Nipple): Filling, red/small blisters or bruises, mild/mod discomfort  Problem noted: Mild/Moderate discomfort Interventions (Mild/moderate discomfort): Post-pump;Hand expression;Hand massage        Lactation Tools Discussed/Used Tools: Pump Breast pump type: Double-Electric Breast Pump Pump Review: Setup, frequency, and cleaning;Milk Storage Initiated by:: Peri Jefferson RN IBCLC Date initiated:: 03/24/17   Consult Status Consult Status: Follow-up Date: 03/24/17 Follow-up type: In-patient    Matvey Llanas, Diamond Nickel 03/24/2017, 7:23 AM

## 2017-03-24 NOTE — Lactation Note (Signed)
This note was copied from a baby's chart. Lactation Consultation Note RN reported baby is fussy and wanting to stay on the breast constantly since birth and parents hasn't slept. Baby has had good out put. Mom stated she thought baby had gas. Baby is currently sleeping and mom and dad going to sleep. Encouraged mom if need LC to consult call for assistance. Mom stated at this time they are good.  Patient Name: Maureen Armstrong UXLKG'M Date: 03/24/2017 Reason for consult: Follow-up assessment   Maternal Data    Feeding Feeding Type: Breast Fed Length of feed: 1 min  LATCH Score/Interventions Latch: Grasps breast easily, tongue down, lips flanged, rhythmical sucking. Intervention(s): Assist with latch  Audible Swallowing: A few with stimulation Intervention(s): Skin to skin  Type of Nipple: Everted at rest and after stimulation  Comfort (Breast/Nipple): Filling, red/small blisters or bruises, mild/mod discomfort  Problem noted: Mild/Moderate discomfort  Hold (Positioning): No assistance needed to correctly position infant at breast. Intervention(s): Skin to skin  LATCH Score: 8  Lactation Tools Discussed/Used     Consult Status Consult Status: Follow-up Date: 03/24/17 Follow-up type: In-patient    Charyl Dancer 03/24/2017, 12:49 AM

## 2017-03-25 ENCOUNTER — Encounter (HOSPITAL_COMMUNITY): Admission: RE | Admit: 2017-03-25 | Payer: BLUE CROSS/BLUE SHIELD | Source: Ambulatory Visit

## 2017-03-25 ENCOUNTER — Encounter (HOSPITAL_COMMUNITY): Payer: Self-pay | Admitting: *Deleted

## 2017-03-25 HISTORY — DX: Depression, unspecified: F32.A

## 2017-03-25 HISTORY — DX: Major depressive disorder, single episode, unspecified: F32.9

## 2017-03-25 HISTORY — DX: Cardiac murmur, unspecified: R01.1

## 2017-03-25 MED ORDER — OXYCODONE HCL 5 MG PO TABS: 5.0000 mg | ORAL_TABLET | ORAL | 0 refills | Status: AC | PRN

## 2017-03-25 MED ORDER — IBUPROFEN 600 MG PO TABS: 600.0000 mg | ORAL_TABLET | Freq: Four times a day (QID) | ORAL | 0 refills | Status: AC

## 2017-03-25 NOTE — Discharge Instructions (Signed)
As per discharge pamphlet °

## 2017-03-25 NOTE — Progress Notes (Signed)
POD #3 Doing well Afeb, VSS Abd- soft, fundus firm, incision intact D/c home

## 2017-03-25 NOTE — Discharge Summary (Signed)
°    OB Discharge Summary     Patient Name: Maureen Armstrong DOB: 1981-06-18 MRN: [id number]  Date of admission: 03/22/2017 Delivering MD: Pryor Ochoa [facility]   Date of discharge: 03/25/2017  Admitting diagnosis: 38WKS,WATER BROKE Intrauterine pregnancy: [redacted]w[redacted]d     Secondary diagnosis:  Active Problems:   Postpartum care following cesarean delivery      Discharge diagnosis: Term Pregnancy Delivered                                                                                                 Hospital course:  Onset of Labor With Unplanned C/S  36 y.o. yo [id number] at 108w0d was admitted in Latent Labor with SROM on 03/22/2017. Patient had a labor course significant for previous c-section. Membrane Rupture Time/Date: 5:30 AM ,03/22/2017   The patient went for cesarean section due to Elective Repeat, and delivered a Viable infant,03/22/2017.  She had BTL also. Details of operation can be found in separate operative note. Patient had an uncomplicated postpartum course.  She is ambulating,tolerating a regular diet, passing flatus, and urinating well.  Patient is discharged home in stable condition 03/25/17.  Physical exam  Vitals:   03/23/17 1730 03/24/17 0500 03/24/17 1852 03/25/17 0622  BP: 117/67 119/64 126/66 122/61  Pulse: 74 67 79 69  Resp: Temp: 98 F (36.7 C) 98.3 F (36.8 C) 98.3 F (36.8 C) 98.2 F (36.8 C)  TempSrc: Oral Oral Oral Oral  SpO2:      Weight:      Height:       General: alert Lochia: appropriate Uterine Fundus: firm Incision: Healing well with no significant drainage  Labs: Lab Results  Component Value Date   WBC 11.3 (H) 03/23/2017   HGB 11.3 (L) 03/23/2017   HCT 32.5 (L) 03/23/2017   MCV 93.9 03/23/2017   PLT 156 03/23/2017   No flowsheet data found.  Discharge instruction: per After Visit Summary and "Baby and Me Booklet".  After visit meds:  Allergies as of 03/25/2017      Reactions   Adhesive [tape] Rash      Medication List     TAKE these medications   diphenhydrAMINE 25 MG tablet Commonly known as:  BENADRYL Take 25 mg by mouth every 6 (six) hours as needed.   ibuprofen 600 MG tablet Commonly known as:  ADVIL,MOTRIN Take 1 tablet (600 mg total) by mouth every 6 (six) hours.   oxyCODONE 5 MG immediate release tablet Commonly known as:  Oxy IR/ROXICODONE Take 1 tablet (5 mg total) by mouth every 4 (four) hours as needed for severe pain.   prenatal multivitamin Tabs tablet Take 1 tablet by mouth at bedtime.       Diet: routine diet  Activity: Advance as tolerated. Pelvic rest for 6 weeks.   Outpatient follow up:2 weeks  Postpartum contraception: Tubal Ligation  Newborn Data: Live born female  Birth Weight: 7 lb 7.8 oz (3395 g) APGAR: 9, 9  Baby Feeding: Breast Disposition:home with mother   03/25/2017 Zenaida Niece, MD

## 2017-03-25 NOTE — Lactation Note (Signed)
This note was copied from a baby's chart. Lactation Consultation Note  Patient Name: Maureen Armstrong Date: 03/25/2017  Mom just finished feeding baby at breast.  Breasts are becoming engorged.  Ice packs given with instructions to use every 2-3 hours for 20-30 minutes.  Recommended frequent feedings with good breast massage and post pump for comfort as needed.  Mom will continue to take ibuprofen for the swelling and discomfort.  Questions answered.  Reviewed outpatient lactation services and encouraged to call prn.   Maternal Data    Feeding Feeding Type: Breast Fed Length of feed: 10 min  LATCH Score/Interventions Latch: Grasps breast easily, tongue down, lips flanged, rhythmical sucking.  Audible Swallowing: A few with stimulation Intervention(s): Skin to skin  Type of Nipple: Everted at rest and after stimulation  Comfort (Breast/Nipple): Filling, red/small blisters or bruises, mild/mod discomfort  Problem noted: Mild/Moderate discomfort Interventions (Filling): Frequent nursing Interventions (Mild/moderate discomfort): Hand massage  Hold (Positioning): No assistance needed to correctly position infant at breast.  LATCH Score: 8  Lactation Tools Discussed/Used     Consult Status      Huston Foley 03/25/2017, 9:28 AM

## 2017-03-28 ENCOUNTER — Inpatient Hospital Stay (HOSPITAL_COMMUNITY)
Admission: AD | Admit: 2017-03-28 | Payer: BLUE CROSS/BLUE SHIELD | Source: Ambulatory Visit | Admitting: Obstetrics and Gynecology

## 2017-03-28 SURGERY — Surgical Case
Anesthesia: Regional

## 2017-07-01 NOTE — Addendum Note (Signed)
Addendum  created 07/01/17 1031 by Cristela BlueJackson, Juanell Saffo, MD   Sign clinical note

## 2017-07-01 NOTE — Anesthesia Postprocedure Evaluation (Signed)
Anesthesia Post Note  Patient: Maureen CiproLindsay Armstrong  Procedure(s) Performed: Procedure(s) (LRB): CESAREAN SECTION (N/A) BILATERAL TUBAL LIGATION (Bilateral)     Anesthesia Post Evaluation  Last Vitals:  Vitals:   03/24/17 1852 03/25/17 0622  BP: 126/66 122/61  Pulse: 79 69  Resp: 18 18  Temp: 36.8 C 36.8 C    Last Pain:  Vitals:   03/25/17 0852  TempSrc:   PainSc: 1                  Darlean Warmoth EDWARD

## 2018-01-31 DIAGNOSIS — J309 Allergic rhinitis, unspecified: Secondary | ICD-10-CM | POA: Diagnosis not present

## 2018-01-31 DIAGNOSIS — Z79899 Other long term (current) drug therapy: Secondary | ICD-10-CM | POA: Diagnosis not present

## 2018-01-31 DIAGNOSIS — Z6828 Body mass index (BMI) 28.0-28.9, adult: Secondary | ICD-10-CM | POA: Diagnosis not present

## 2018-01-31 DIAGNOSIS — E559 Vitamin D deficiency, unspecified: Secondary | ICD-10-CM | POA: Diagnosis not present

## 2018-01-31 DIAGNOSIS — Z1331 Encounter for screening for depression: Secondary | ICD-10-CM | POA: Diagnosis not present

## 2018-01-31 DIAGNOSIS — F419 Anxiety disorder, unspecified: Secondary | ICD-10-CM | POA: Diagnosis not present

## 2018-06-05 DIAGNOSIS — D2262 Melanocytic nevi of left upper limb, including shoulder: Secondary | ICD-10-CM | POA: Diagnosis not present

## 2018-06-05 DIAGNOSIS — D225 Melanocytic nevi of trunk: Secondary | ICD-10-CM | POA: Diagnosis not present

## 2018-06-05 DIAGNOSIS — L814 Other melanin hyperpigmentation: Secondary | ICD-10-CM | POA: Diagnosis not present

## 2018-06-05 DIAGNOSIS — D2261 Melanocytic nevi of right upper limb, including shoulder: Secondary | ICD-10-CM | POA: Diagnosis not present

## 2019-01-09 DIAGNOSIS — S0512XA Contusion of eyeball and orbital tissues, left eye, initial encounter: Secondary | ICD-10-CM | POA: Diagnosis not present

## 2019-01-09 DIAGNOSIS — J309 Allergic rhinitis, unspecified: Secondary | ICD-10-CM | POA: Diagnosis not present

## 2019-01-09 DIAGNOSIS — Z6829 Body mass index (BMI) 29.0-29.9, adult: Secondary | ICD-10-CM | POA: Diagnosis not present

## 2019-01-09 DIAGNOSIS — S0990XA Unspecified injury of head, initial encounter: Secondary | ICD-10-CM | POA: Diagnosis not present

## 2019-01-15 DIAGNOSIS — R0602 Shortness of breath: Secondary | ICD-10-CM | POA: Diagnosis not present

## 2019-01-15 DIAGNOSIS — J111 Influenza due to unidentified influenza virus with other respiratory manifestations: Secondary | ICD-10-CM | POA: Diagnosis not present

## 2019-01-15 DIAGNOSIS — R05 Cough: Secondary | ICD-10-CM | POA: Diagnosis not present

## 2019-05-15 DIAGNOSIS — E559 Vitamin D deficiency, unspecified: Secondary | ICD-10-CM | POA: Diagnosis not present

## 2019-05-15 DIAGNOSIS — Z1231 Encounter for screening mammogram for malignant neoplasm of breast: Secondary | ICD-10-CM | POA: Diagnosis not present

## 2019-05-15 DIAGNOSIS — F419 Anxiety disorder, unspecified: Secondary | ICD-10-CM | POA: Diagnosis not present

## 2019-05-15 DIAGNOSIS — J309 Allergic rhinitis, unspecified: Secondary | ICD-10-CM | POA: Diagnosis not present

## 2019-05-22 DIAGNOSIS — U071 COVID-19: Secondary | ICD-10-CM | POA: Diagnosis not present

## 2019-05-22 DIAGNOSIS — Z20828 Contact with and (suspected) exposure to other viral communicable diseases: Secondary | ICD-10-CM | POA: Diagnosis not present

## 2019-05-25 DIAGNOSIS — Z13220 Encounter for screening for lipoid disorders: Secondary | ICD-10-CM | POA: Diagnosis not present

## 2019-05-25 DIAGNOSIS — Z79899 Other long term (current) drug therapy: Secondary | ICD-10-CM | POA: Diagnosis not present

## 2019-05-25 DIAGNOSIS — E559 Vitamin D deficiency, unspecified: Secondary | ICD-10-CM | POA: Diagnosis not present

## 2019-05-25 DIAGNOSIS — Z131 Encounter for screening for diabetes mellitus: Secondary | ICD-10-CM | POA: Diagnosis not present

## 2019-08-02 DIAGNOSIS — J309 Allergic rhinitis, unspecified: Secondary | ICD-10-CM | POA: Diagnosis not present

## 2019-08-02 DIAGNOSIS — F419 Anxiety disorder, unspecified: Secondary | ICD-10-CM | POA: Diagnosis not present

## 2019-08-02 DIAGNOSIS — E785 Hyperlipidemia, unspecified: Secondary | ICD-10-CM | POA: Diagnosis not present

## 2019-08-02 DIAGNOSIS — Z1331 Encounter for screening for depression: Secondary | ICD-10-CM | POA: Diagnosis not present

## 2019-08-02 DIAGNOSIS — E559 Vitamin D deficiency, unspecified: Secondary | ICD-10-CM | POA: Diagnosis not present

## 2019-11-26 DIAGNOSIS — E785 Hyperlipidemia, unspecified: Secondary | ICD-10-CM | POA: Diagnosis not present

## 2019-11-26 DIAGNOSIS — E559 Vitamin D deficiency, unspecified: Secondary | ICD-10-CM | POA: Diagnosis not present

## 2019-11-26 DIAGNOSIS — J309 Allergic rhinitis, unspecified: Secondary | ICD-10-CM | POA: Diagnosis not present

## 2019-11-26 DIAGNOSIS — F419 Anxiety disorder, unspecified: Secondary | ICD-10-CM | POA: Diagnosis not present

## 2020-03-03 DIAGNOSIS — E559 Vitamin D deficiency, unspecified: Secondary | ICD-10-CM | POA: Diagnosis not present

## 2020-03-03 DIAGNOSIS — F419 Anxiety disorder, unspecified: Secondary | ICD-10-CM | POA: Diagnosis not present

## 2020-03-03 DIAGNOSIS — F329 Major depressive disorder, single episode, unspecified: Secondary | ICD-10-CM | POA: Diagnosis not present

## 2020-03-03 DIAGNOSIS — J309 Allergic rhinitis, unspecified: Secondary | ICD-10-CM | POA: Diagnosis not present

## 2020-06-05 DIAGNOSIS — F419 Anxiety disorder, unspecified: Secondary | ICD-10-CM | POA: Diagnosis not present

## 2020-06-05 DIAGNOSIS — Z0000 Encounter for general adult medical examination without abnormal findings: Secondary | ICD-10-CM | POA: Diagnosis not present

## 2020-06-05 DIAGNOSIS — E559 Vitamin D deficiency, unspecified: Secondary | ICD-10-CM | POA: Diagnosis not present

## 2020-06-05 DIAGNOSIS — Z13220 Encounter for screening for lipoid disorders: Secondary | ICD-10-CM | POA: Diagnosis not present

## 2020-06-05 DIAGNOSIS — F329 Major depressive disorder, single episode, unspecified: Secondary | ICD-10-CM | POA: Diagnosis not present

## 2020-06-13 DIAGNOSIS — Z1231 Encounter for screening mammogram for malignant neoplasm of breast: Secondary | ICD-10-CM | POA: Diagnosis not present

## 2020-06-17 DIAGNOSIS — M545 Low back pain: Secondary | ICD-10-CM | POA: Diagnosis not present

## 2020-06-17 DIAGNOSIS — M546 Pain in thoracic spine: Secondary | ICD-10-CM | POA: Diagnosis not present

## 2020-06-25 DIAGNOSIS — M546 Pain in thoracic spine: Secondary | ICD-10-CM | POA: Diagnosis not present

## 2020-06-25 DIAGNOSIS — M545 Low back pain: Secondary | ICD-10-CM | POA: Diagnosis not present

## 2020-06-27 DIAGNOSIS — M546 Pain in thoracic spine: Secondary | ICD-10-CM | POA: Diagnosis not present

## 2020-06-27 DIAGNOSIS — M545 Low back pain: Secondary | ICD-10-CM | POA: Diagnosis not present

## 2020-07-01 DIAGNOSIS — M545 Low back pain: Secondary | ICD-10-CM | POA: Diagnosis not present

## 2020-07-01 DIAGNOSIS — M546 Pain in thoracic spine: Secondary | ICD-10-CM | POA: Diagnosis not present

## 2020-07-03 DIAGNOSIS — M546 Pain in thoracic spine: Secondary | ICD-10-CM | POA: Diagnosis not present

## 2020-07-03 DIAGNOSIS — M545 Low back pain: Secondary | ICD-10-CM | POA: Diagnosis not present

## 2020-07-22 DIAGNOSIS — M545 Low back pain: Secondary | ICD-10-CM | POA: Diagnosis not present

## 2020-07-22 DIAGNOSIS — M546 Pain in thoracic spine: Secondary | ICD-10-CM | POA: Diagnosis not present

## 2020-07-28 DIAGNOSIS — M546 Pain in thoracic spine: Secondary | ICD-10-CM | POA: Diagnosis not present

## 2020-07-28 DIAGNOSIS — M545 Low back pain: Secondary | ICD-10-CM | POA: Diagnosis not present

## 2020-09-22 DIAGNOSIS — E785 Hyperlipidemia, unspecified: Secondary | ICD-10-CM | POA: Diagnosis not present

## 2020-09-22 DIAGNOSIS — F419 Anxiety disorder, unspecified: Secondary | ICD-10-CM | POA: Diagnosis not present

## 2020-09-22 DIAGNOSIS — E559 Vitamin D deficiency, unspecified: Secondary | ICD-10-CM | POA: Diagnosis not present

## 2020-09-22 DIAGNOSIS — F32A Depression, unspecified: Secondary | ICD-10-CM | POA: Diagnosis not present

## 2020-12-29 DIAGNOSIS — J309 Allergic rhinitis, unspecified: Secondary | ICD-10-CM | POA: Diagnosis not present

## 2020-12-29 DIAGNOSIS — E785 Hyperlipidemia, unspecified: Secondary | ICD-10-CM | POA: Diagnosis not present

## 2020-12-29 DIAGNOSIS — E559 Vitamin D deficiency, unspecified: Secondary | ICD-10-CM | POA: Diagnosis not present

## 2020-12-29 DIAGNOSIS — F419 Anxiety disorder, unspecified: Secondary | ICD-10-CM | POA: Diagnosis not present

## 2021-03-12 DIAGNOSIS — Z6829 Body mass index (BMI) 29.0-29.9, adult: Secondary | ICD-10-CM | POA: Diagnosis not present

## 2021-03-12 DIAGNOSIS — J309 Allergic rhinitis, unspecified: Secondary | ICD-10-CM | POA: Diagnosis not present

## 2021-03-12 DIAGNOSIS — J019 Acute sinusitis, unspecified: Secondary | ICD-10-CM | POA: Diagnosis not present

## 2021-03-25 DIAGNOSIS — E785 Hyperlipidemia, unspecified: Secondary | ICD-10-CM | POA: Diagnosis not present

## 2021-03-25 DIAGNOSIS — F419 Anxiety disorder, unspecified: Secondary | ICD-10-CM | POA: Diagnosis not present

## 2021-03-25 DIAGNOSIS — E559 Vitamin D deficiency, unspecified: Secondary | ICD-10-CM | POA: Diagnosis not present

## 2021-03-25 DIAGNOSIS — F32A Depression, unspecified: Secondary | ICD-10-CM | POA: Diagnosis not present

## 2021-07-09 DIAGNOSIS — Z0000 Encounter for general adult medical examination without abnormal findings: Secondary | ICD-10-CM | POA: Diagnosis not present

## 2021-07-09 DIAGNOSIS — Z79899 Other long term (current) drug therapy: Secondary | ICD-10-CM | POA: Diagnosis not present

## 2021-07-09 DIAGNOSIS — E559 Vitamin D deficiency, unspecified: Secondary | ICD-10-CM | POA: Diagnosis not present

## 2021-10-19 DIAGNOSIS — J309 Allergic rhinitis, unspecified: Secondary | ICD-10-CM | POA: Diagnosis not present

## 2021-10-19 DIAGNOSIS — E559 Vitamin D deficiency, unspecified: Secondary | ICD-10-CM | POA: Diagnosis not present

## 2021-10-19 DIAGNOSIS — E785 Hyperlipidemia, unspecified: Secondary | ICD-10-CM | POA: Diagnosis not present

## 2021-10-19 DIAGNOSIS — F419 Anxiety disorder, unspecified: Secondary | ICD-10-CM | POA: Diagnosis not present

## 2021-11-02 ENCOUNTER — Other Ambulatory Visit: Payer: Self-pay

## 2021-11-02 ENCOUNTER — Ambulatory Visit
Admission: RE | Admit: 2021-11-02 | Discharge: 2021-11-02 | Disposition: A | Discharge status: Home / Self Care | Payer: BC Managed Care – PPO | Source: Ambulatory Visit | Attending: Internal Medicine | Admitting: Internal Medicine

## 2021-11-02 ENCOUNTER — Other Ambulatory Visit: Payer: Self-pay | Admitting: Internal Medicine

## 2021-11-02 DIAGNOSIS — Z1231 Encounter for screening mammogram for malignant neoplasm of breast: Secondary | ICD-10-CM

## 2022-05-15 ENCOUNTER — Ambulatory Visit
Admission: EM | Admit: 2022-05-15 | Discharge: 2022-05-15 | Disposition: A | Discharge status: Home / Self Care | Payer: 59

## 2022-05-15 ENCOUNTER — Encounter: Payer: Self-pay | Admitting: Emergency Medicine

## 2022-05-15 DIAGNOSIS — T148XXA Other injury of unspecified body region, initial encounter: Secondary | ICD-10-CM | POA: Diagnosis not present

## 2022-05-15 NOTE — ED Triage Notes (Signed)
Pt presents with a ?blister or bug bite to her left inner thigh that she noticed this morning.

## 2022-05-15 NOTE — Discharge Instructions (Signed)
Apply neosporin the affected area twice daily for 5-7 days. Bandage until drainage resolves.

## 2022-05-15 NOTE — ED Provider Notes (Signed)
[facility]    CSN: [id number] Arrival date & time: 05/15/22  1431      History   Chief Complaint Chief Complaint  Patient presents with   Blister    HPI Maureen Armstrong is a 41 y.o. female.   HPI Patient presents today with a blister on her inner left thigh.  She noticed it this morning.  She was concerned she may have been bit by an insect or a spider.  She reports is slightly tender.  She has no active swelling or redness extending away from the blister. The blister is currently non-drainage. Past Medical History:  Diagnosis Date   Depression    since adolescence  off meds   Heart murmur    Infection    UTI   Scoliosis     Patient Active Problem List   Diagnosis Date Noted   Postpartum care following cesarean delivery 03/22/2017   H/O cesarean section 12/14/2013   Indication for care in labor or delivery 12/13/2013   BACK PAIN, LUMBAR 09/14/2010   SCOLIOSIS 09/14/2010    Past Surgical History:  Procedure Laterality Date   CESAREAN SECTION N/A 12/14/2013   Procedure: Primary Cesarean Section Delivery Baby Girl @ 0338, Apgars;  Surgeon: Bing Plumehomas F Henley, MD;  Location: [facility];  Service: Obstetrics;  Laterality: N/A;   CESAREAN SECTION N/A 03/22/2017   Procedure: CESAREAN SECTION;  Surgeon: Edwinna Areolaecilia Worema Banga, DO;  Location: [facility];  Service: Obstetrics;  Laterality: N/A;   DILATION AND CURETTAGE OF UTERUS     FOOT SURGERY     TUBAL LIGATION Bilateral 03/22/2017   Procedure: BILATERAL TUBAL LIGATION;  Surgeon: Edwinna Areolaecilia Worema Banga, DO;  Location: [facility];  Service: Obstetrics;  Laterality: Bilateral;   WISDOM TOOTH EXTRACTION      OB History     Gravida  4   Para  1   Term  1   Preterm  0   AB  2   Living  1      SAB  0   IAB  2   Ectopic  0   Multiple      Live Births  1            Home Medications    Prior to Admission medications   Medication Sig Start Date End Date Taking? Authorizing Provider   DULoxetine (CYMBALTA) 60 MG capsule Take 60 mg by mouth daily. 05/14/22  Yes [provider]  montelukast (SINGULAIR) 10 MG tablet Take 10 mg by mouth daily. 05/14/22  Yes [provider]  Vitamin D, Ergocalciferol, (DRISDOL) 1.25 MG (50000 UNIT) CAPS capsule Take 50,000 Units by mouth once a week. 01/20/22  Yes [provider]  diphenhydrAMINE (BENADRYL) 25 MG tablet Take 25 mg by mouth every 6 (six) hours as needed.    [provider]  ibuprofen (ADVIL,MOTRIN) 600 MG tablet Take 1 tablet (600 mg total) by mouth every 6 (six) hours. 03/25/17   Meisinger, Tawanna Coolerodd, MD    Family History Family History  Problem Relation Age of Onset   Heart disease Father    Cancer Father    Cancer Maternal Grandmother        colon   Heart disease Maternal Grandfather    Diabetes Maternal Grandfather    Heart disease Paternal Grandmother    Stroke Paternal Grandfather    Mental retardation Cousin    Breast cancer Other     Social History Social History   Tobacco Use  Smoking status: Every Day    Types: Cigarettes   Smokeless tobacco: Never   Tobacco comments:    quit with preg  Vaping Use   Vaping Use: Never used  Substance Use Topics   Alcohol use: No   Drug use: No     Allergies   Adhesive [tape]   Review of Systems Review of Systems Pertinent negatives listed in HPI   Physical Exam Triage Vital Signs ED Triage Vitals  Enc Vitals Group     BP 05/15/22 1453 137/90     Pulse Rate 05/15/22 1450 73     Resp 05/15/22 1450 18     Temp 05/15/22 1450 98.6 F (37 C)     Temp Source 05/15/22 1450 Oral     SpO2 05/15/22 1450 100 %     Weight --      Height --      Head Circumference --      Peak Flow --      Pain Score 05/15/22 1450 5     Pain Loc --      Pain Edu? --      Excl. in GC? --    No data found.  Updated Vital Signs BP 137/90 (BP Location: Left Arm)   Pulse 73   Temp 98.6 F (37 C) (Oral)   Resp 18   LMP 05/13/2022   SpO2 100%    Visual Acuity Right Eye Distance:   Left Eye Distance:   Bilateral Distance:    Right Eye Near:   Left Eye Near:    Bilateral Near:     Physical Exam Constitutional:      Appearance: Normal appearance.  Cardiovascular:     Rate and Rhythm: Normal rate and regular rhythm.  Pulmonary:     Effort: Pulmonary effort is normal.     Breath sounds: Normal breath sounds.  Skin:    General: Skin is warm.     Capillary Refill: Capillary refill takes less than 2 seconds.       Neurological:     Mental Status: She is alert and oriented to person, place, and time.     GCS: GCS eye subscore is 4. GCS verbal subscore is 5. GCS motor subscore is 6.  Psychiatric:        Attention and Perception: Attention normal.        Mood and Affect: Mood normal.        Speech: Speech normal.   UC Treatments / Results  Labs (all labs ordered are listed, but only abnormal results are displayed) Labs Reviewed - No data to display  EKG   Radiology No results found.  Procedures Procedures (including critical care time)  Medications Ordered in UC Medications - No data to display  Initial Impression / Assessment and Plan / UC Course  I have reviewed the triage vital signs and the nursing notes.  Pertinent labs & imaging results that were available during my care of the patient were reviewed by me and considered in my medical decision making (see chart for details).    Blister, benign, unroofed, scant serous drainage expressed. Apply neomycin to site. Bandage as needed.  Final Clinical Impressions(s) / UC Diagnoses   Final diagnoses:  Blister     Discharge Instructions      Apply neosporin the affected area twice daily for 5-7 days. Bandage until drainage resolves.     ED Prescriptions   None    PDMP not reviewed this encounter.  Bing Neighbors, FNP 05/15/22 [phone]

## 2022-10-09 IMAGING — MG MM DIGITAL SCREENING BILAT W/ TOMO AND CAD
6 of 10 series · 6 of 30 positions shown · non-contrast
Comparison: Previous exam(s).

CLINICAL DATA: Screening.

EXAM:
DIGITAL SCREENING BILATERAL MAMMOGRAM WITH TOMOSYNTHESIS AND CAD
TECHNIQUE: Bilateral screening digital craniocaudal and mediolateral oblique
mammograms were obtained. Bilateral screening digital breast
tomosynthesis was performed. The images were evaluated with
computer-aided detection.

[L CC synth-2D]
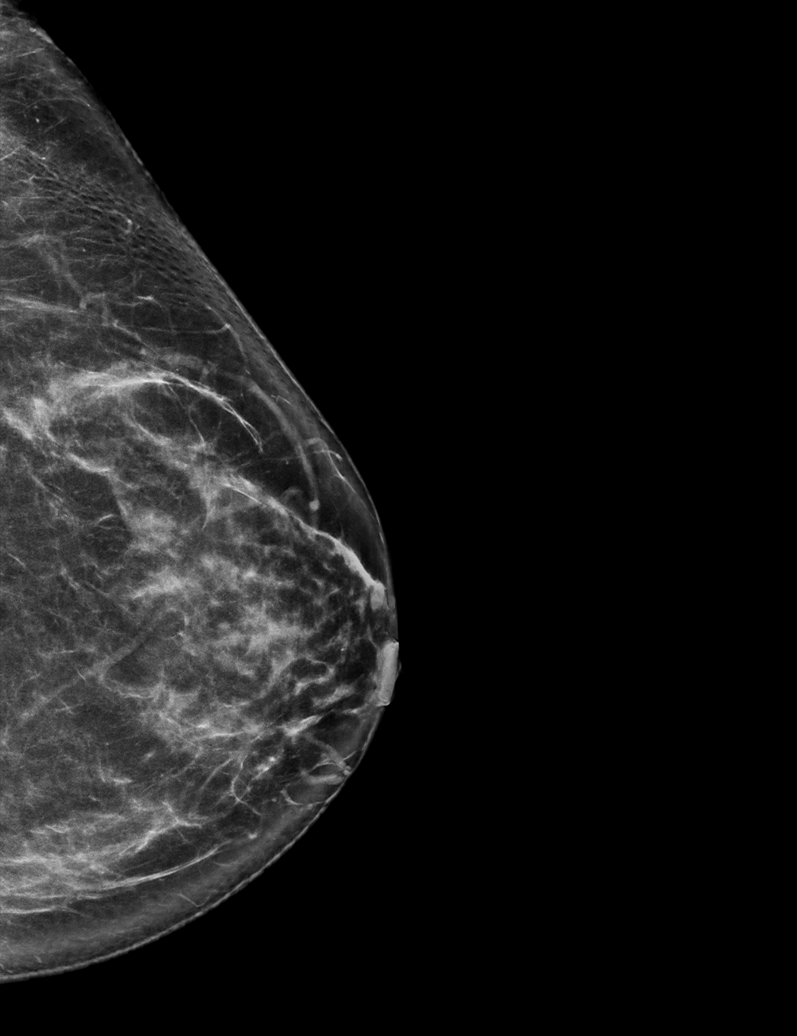

[R XCCL synth-2D]
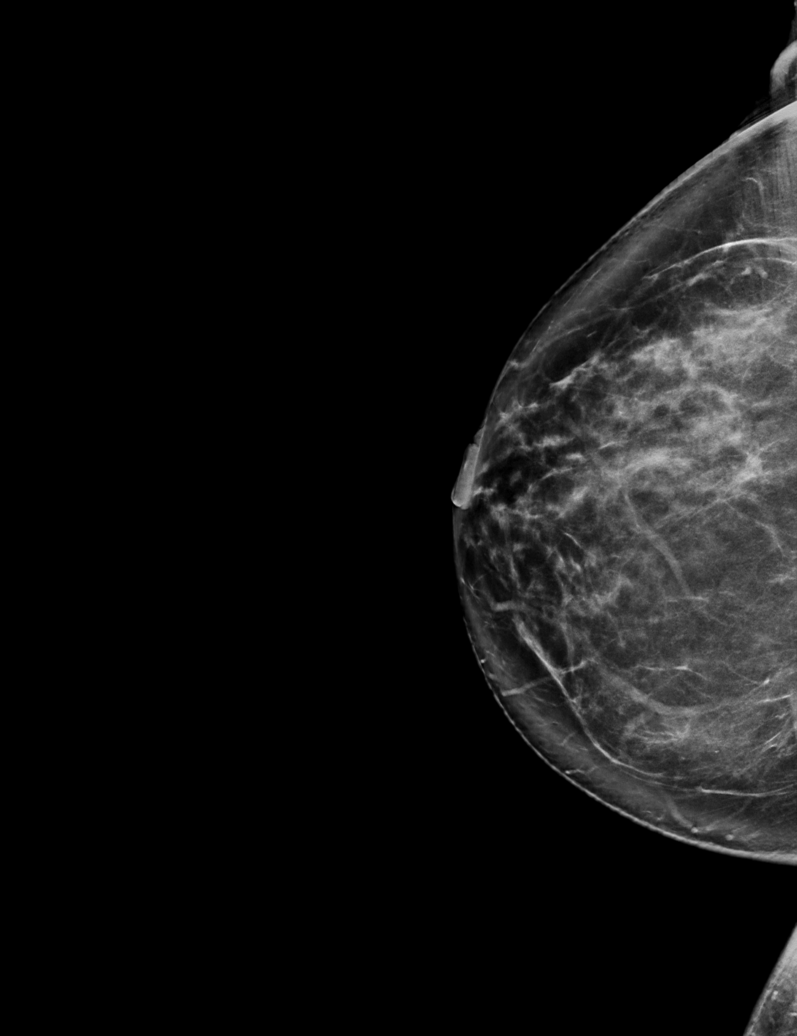

[R CC synth-2D]
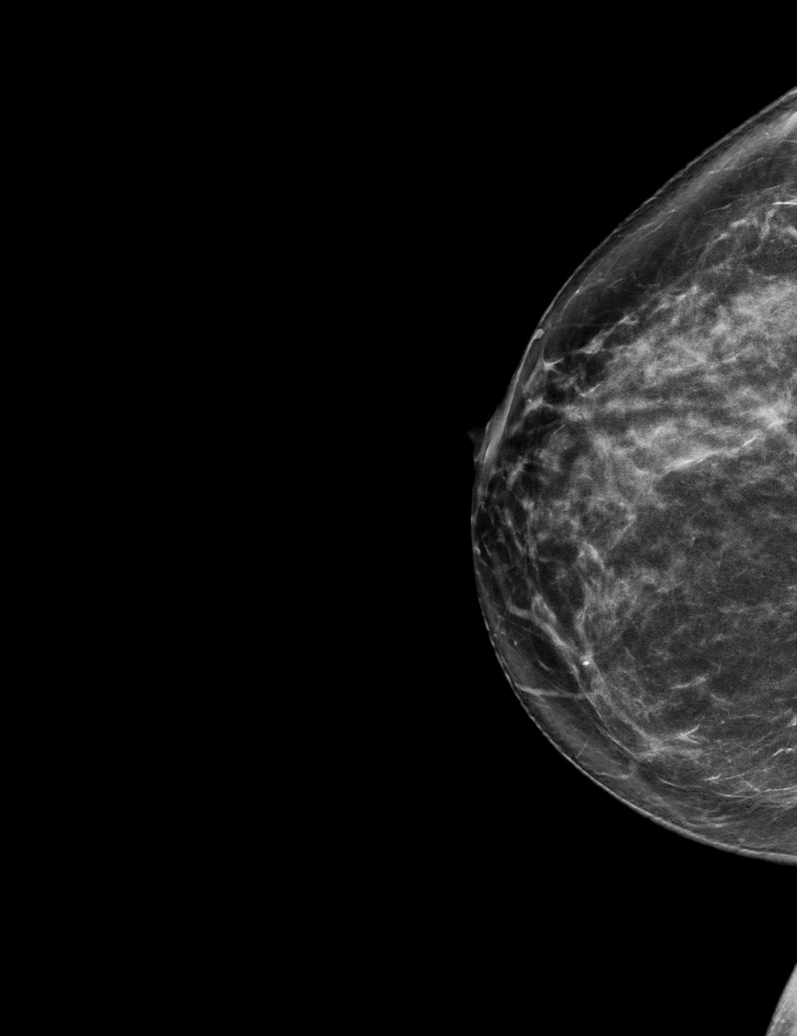

[L MLO synth-2D]
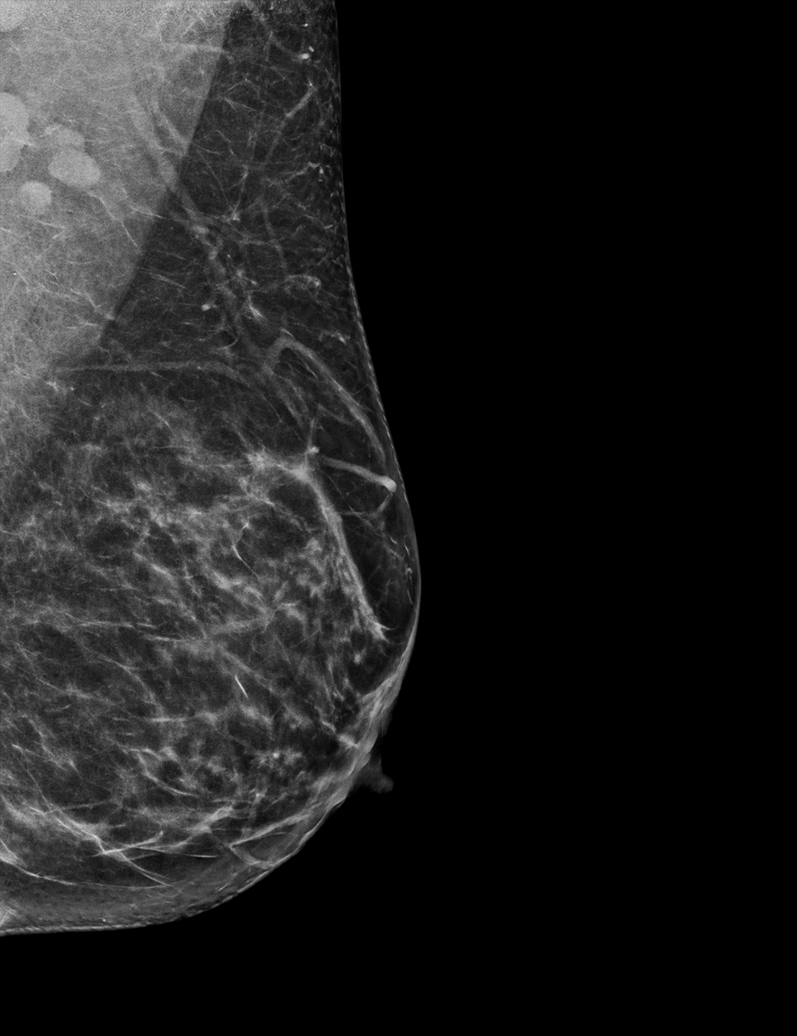

[R MLO synth-2D]
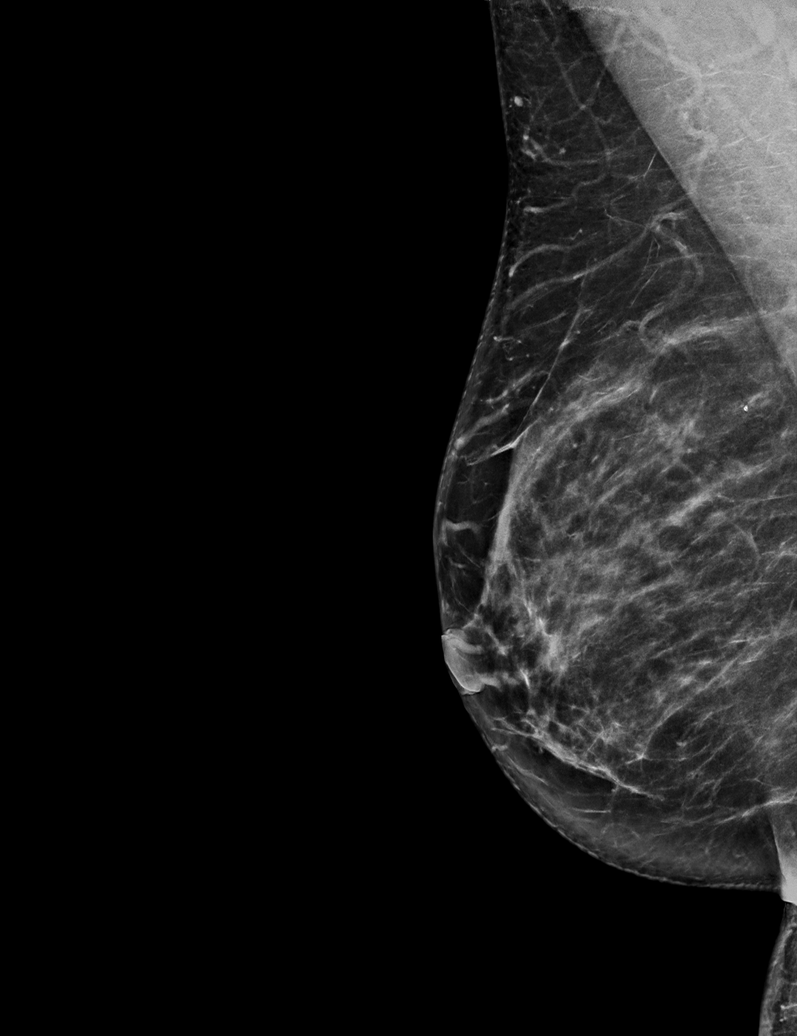

[L CC tomo · tomo slice 38/75.0]
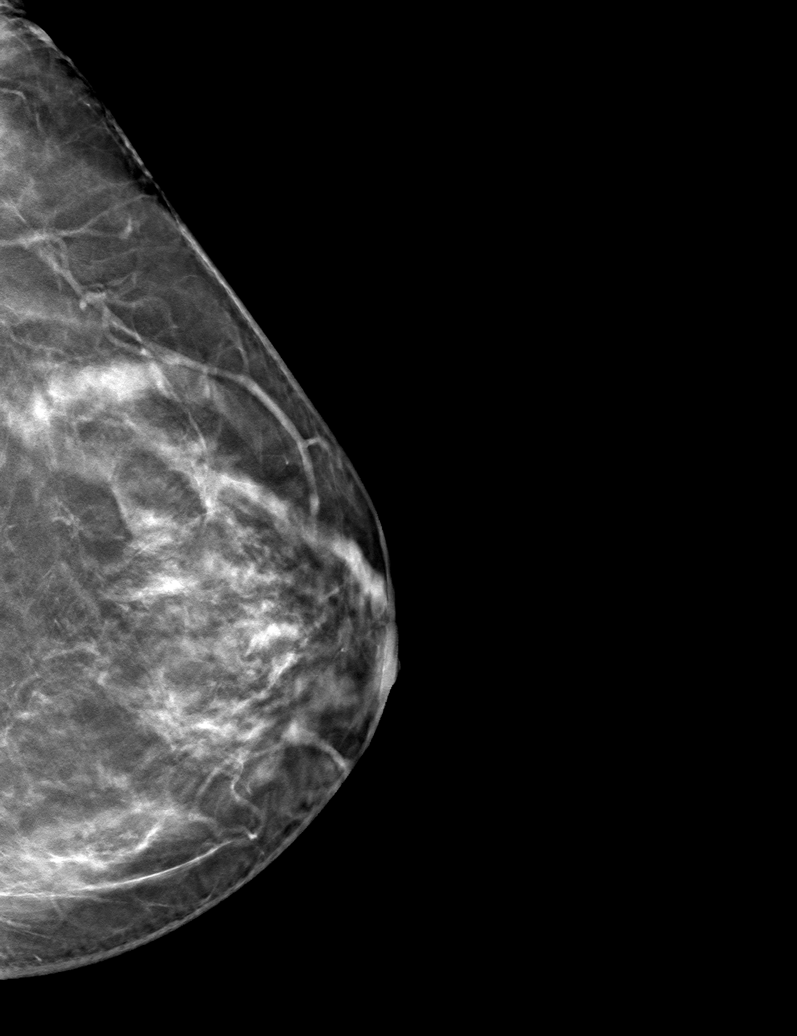

[6 of 30 positions shown; findings below may reference images not displayed]

ACR Breast Density Category b: There are scattered areas of
fibroglandular density.
FINDINGS: There are no findings suspicious for malignancy.
IMPRESSION: No mammographic evidence of malignancy. A result letter of this
screening mammogram will be mailed directly to the patient.

RECOMMENDATION:
Screening mammogram in one year. (Code:51-O-LD2)

BI-RADS CATEGORY  1: Negative.

## 2023-09-07 ENCOUNTER — Ambulatory Visit
Admission: EM | Admit: 2023-09-07 | Discharge: 2023-09-07 | Disposition: A | Discharge status: Home / Self Care | Payer: 59

## 2023-09-07 ENCOUNTER — Encounter: Payer: Self-pay | Admitting: Emergency Medicine

## 2023-09-07 DIAGNOSIS — S61216A Laceration without foreign body of right little finger without damage to nail, initial encounter: Secondary | ICD-10-CM | POA: Diagnosis not present

## 2023-09-07 NOTE — ED Triage Notes (Signed)
Pt cut her right pinky finger on a Mandelin last night around 5:30pm

## 2023-09-07 NOTE — ED Provider Notes (Signed)
Maureen Armstrong    CSN: [id number] Arrival date & time: 09/07/23  0801      History   Chief Complaint Chief Complaint  Patient presents with   Laceration    HPI Maureen Armstrong is a 42 y.o. female.   Patient presents for evaluation of a laceration to the right fifth finger occurring approximately around 5:30 PM last night.  Was cutting vegetables on a mandolin when injury occurred.  Able to control bleeding by applying pressure and covering with a bandage.  Has full range of motion of finger.  Denies numbness or tingling.  Last tetanus 2018.  Past Medical History:  Diagnosis Date   Depression    since adolescence  off meds   Heart murmur    Infection    UTI   Scoliosis     Patient Active Problem List   Diagnosis Date Noted   Postpartum care following cesarean delivery 03/22/2017   H/O cesarean section 12/14/2013   Indication for care in labor or delivery 12/13/2013   BACK PAIN, LUMBAR 09/14/2010   SCOLIOSIS 09/14/2010    Past Surgical History:  Procedure Laterality Date   CESAREAN SECTION N/A 12/14/2013   Procedure: Primary Cesarean Section Delivery Baby Girl @ 0338, Apgars;  Surgeon: Bing Plume, MD;  Location: [facility];  Service: Obstetrics;  Laterality: N/A;   CESAREAN SECTION N/A 03/22/2017   Procedure: CESAREAN SECTION;  Surgeon: Edwinna Areola, DO;  Location: [facility];  Service: Obstetrics;  Laterality: N/A;   DILATION AND CURETTAGE OF UTERUS     FOOT SURGERY     TUBAL LIGATION Bilateral 03/22/2017   Procedure: BILATERAL TUBAL LIGATION;  Surgeon: Edwinna Areola, DO;  Location: [facility];  Service: Obstetrics;  Laterality: Bilateral;   WISDOM TOOTH EXTRACTION      OB History     Gravida  4   Para  1   Term  1   Preterm  0   AB  2   Living  1      SAB  0   IAB  2   Ectopic  0   Multiple      Live Births  1            Home Medications    Prior to Admission medications   Medication Sig Start  Date End Date Taking? Authorizing Provider  diphenhydrAMINE (BENADRYL) 25 MG tablet Take 25 mg by mouth every 6 (six) hours as needed.    [provider]  DULoxetine (CYMBALTA) 60 MG capsule Take 60 mg by mouth daily. 05/14/22   [provider]  ibuprofen (ADVIL,MOTRIN) 600 MG tablet Take 1 tablet (600 mg total) by mouth every 6 (six) hours. 03/25/17   Meisinger, Todd, MD  montelukast (SINGULAIR) 10 MG tablet Take 10 mg by mouth daily. 05/14/22   [provider]  Vitamin D, Ergocalciferol, (DRISDOL) 1.25 MG (50000 UNIT) CAPS capsule Take 50,000 Units by mouth once a week. 01/20/22   [provider]    Family History Family History  Problem Relation Age of Onset   Heart disease Father    Cancer Father    Cancer Maternal Grandmother        colon   Heart disease Maternal Grandfather    Diabetes Maternal Grandfather    Heart disease Paternal Grandmother    Stroke Paternal Grandfather    Mental retardation Cousin    Breast cancer Other     Social History Social History  Tobacco Use   Smoking status: Every Day    Types: Cigarettes   Smokeless tobacco: Never   Tobacco comments:    quit with preg  Vaping Use   Vaping status: Never Used  Substance Use Topics   Alcohol use: No   Drug use: No     Allergies   Adhesive [tape]   Review of Systems Review of Systems  Constitutional: Negative.   Respiratory: Negative.    Cardiovascular: Negative.   Skin:  Positive for wound. Negative for color change, pallor and rash.  Neurological: Negative.      Physical Exam Triage Vital Signs ED Triage Vitals  Encounter Vitals Group     BP 09/07/23 0819 124/84     Systolic BP Percentile --      Diastolic BP Percentile --      Pulse Rate 09/07/23 0819 75     Resp 09/07/23 0819 18     Temp 09/07/23 0819 99.1 F (37.3 C)     Temp Source 09/07/23 0819 Oral     SpO2 09/07/23 0819 96 %     Weight --      Height --      Head Circumference --      Peak  Flow --      Pain Score 09/07/23 0818 0     Pain Loc --      Pain Education --      Exclude from Growth Chart --    No data found.  Updated Vital Signs BP 124/84 (BP Location: Left Arm)   Pulse 75   Temp 99.1 F (37.3 C) (Oral)   Resp 18   LMP 08/24/2023 (Approximate)   SpO2 96%   Visual Acuity Right Eye Distance:   Left Eye Distance:   Bilateral Distance:    Right Eye Near:   Left Eye Near:    Bilateral Near:     Physical Exam Constitutional:      Appearance: Normal appearance.  Eyes:     Extraocular Movements: Extraocular movements intact.  Pulmonary:     Effort: Pulmonary effort is normal.  Skin:    Comments: 1 cm x 0.5 cm depth avulsion laceration to the distal phalanx of the right fifth finger on the palmar aspect, bleeding controlled, sensation intact, capillary refill less than 3, no involvement of the joint, has full range of motion  Neurological:     Mental Status: She is alert and oriented to person, place, and time. Mental status is at baseline.      UC Treatments / Results  Labs (all labs ordered are listed, but only abnormal results are displayed) Labs Reviewed - No data to display  EKG   Radiology No results found.  Procedures Procedures (including critical care time)  Medications Ordered in UC Medications - No data to display  Initial Impression / Assessment and Plan / UC Course  I have reviewed the triage vital signs and the nursing notes.  Pertinent labs & imaging results that were available during my care of the patient were reviewed by me and considered in my medical decision making (see chart for details).  Laceration of right little finger without foreign body without damage to nail, initial encounter  Unable to close due to avulsion, discussed with patient, cleansed with chlorhexidine and applied Steri-Strips and a nonadherent dressing, may remove dressing this afternoon, advised to cleanse daily with soap and water and allowed  Steri-Strips to fall off naturally, advised to monitor for signs of infection  and to return for any concerns regarding healing, up-to-date on tetanus Final Clinical Impressions(s) / UC Diagnoses   Final diagnoses:  Laceration of right little finger without foreign body without damage to nail, initial encounter     Discharge Instructions      Laceration has been cleaned here in the office with antiseptic and covered with Steri-Strips to keep area clean to help prevent infection  Allow Steri-Strips to naturally fall off, do not remove, you may trim the edges as they begin to peel  You may cleanse area daily with soap and water, pat do not rub, may leave open to air or cover with a nonstick bandage if at any risk for becoming contaminated  If you begin to see any signs of infection such as puslike drainage, new redness, increased pain, new swelling please return for further evaluation as you will need antibiotics  Use Tylenol and or Motrin as needed for pain  Area most likely take 1 to 2 weeks to fully heal, may return to urgent care as needed for any concerns regarding healing  Last tetanus was 2018, will not need today    ED Prescriptions   None    PDMP not reviewed this encounter.   Valinda Hoar, [place] 09/07/23 [phone]

## 2023-09-07 NOTE — Discharge Instructions (Addendum)
Laceration has been cleaned here in the office with antiseptic and covered with Steri-Strips to keep area clean to help prevent infection  Allow Steri-Strips to naturally fall off, do not remove, you may trim the edges as they begin to peel  You may cleanse area daily with soap and water, pat do not rub, may leave open to air or cover with a nonstick bandage if at any risk for becoming contaminated  If you begin to see any signs of infection such as puslike drainage, new redness, increased pain, new swelling please return for further evaluation as you will need antibiotics  Use Tylenol and or Motrin as needed for pain  Area most likely take 1 to 2 weeks to fully heal, may return to urgent care as needed for any concerns regarding healing  Last tetanus was 2018, will not need today
